# Patient Record
Sex: Female | Born: 1990 | Race: White | Hispanic: No | Smoking: Former smoker
Health system: Southern US, Community
[De-identification: ages and names within clinical notes are randomized; demographics above are authoritative.]

## PROBLEM LIST (undated history)

## (undated) DIAGNOSIS — F319 Bipolar disorder, unspecified: Secondary | ICD-10-CM

## (undated) DIAGNOSIS — R519 Headache, unspecified: Secondary | ICD-10-CM

## (undated) DIAGNOSIS — G8929 Other chronic pain: Secondary | ICD-10-CM

## (undated) DIAGNOSIS — F419 Anxiety disorder, unspecified: Secondary | ICD-10-CM

## (undated) DIAGNOSIS — F32A Depression, unspecified: Secondary | ICD-10-CM

## (undated) DIAGNOSIS — G44009 Cluster headache syndrome, unspecified, not intractable: Secondary | ICD-10-CM

## (undated) DIAGNOSIS — G43909 Migraine, unspecified, not intractable, without status migrainosus: Secondary | ICD-10-CM

## (undated) DIAGNOSIS — G4700 Insomnia, unspecified: Secondary | ICD-10-CM

## (undated) HISTORY — DX: Migraine, unspecified, not intractable, without status migrainosus: G43.909

## (undated) HISTORY — PX: NO PAST SURGERIES: SHX2092

## (undated) HISTORY — DX: Bipolar disorder, unspecified: F31.9

## (undated) HISTORY — DX: Headache, unspecified: R51.9

## (undated) HISTORY — DX: Depression, unspecified: F32.A

## (undated) HISTORY — DX: Insomnia, unspecified: G47.00

## (undated) HISTORY — DX: Anxiety disorder, unspecified: F41.9

## (undated) HISTORY — DX: Cluster headache syndrome, unspecified, not intractable: G44.009

## (undated) HISTORY — DX: Other chronic pain: G89.29

---

## 2015-05-06 HISTORY — PX: WISDOM TOOTH EXTRACTION: SHX21

## 2016-04-13 ENCOUNTER — Emergency Department (HOSPITAL_COMMUNITY)
Admission: EM | Admit: 2016-04-13 | Discharge: 2016-04-14 | Disposition: A | Discharge status: Home / Self Care | Payer: BLUE CROSS/BLUE SHIELD | Attending: Emergency Medicine | Admitting: Emergency Medicine

## 2016-04-13 ENCOUNTER — Emergency Department (HOSPITAL_COMMUNITY): Payer: BLUE CROSS/BLUE SHIELD

## 2016-04-13 ENCOUNTER — Encounter (HOSPITAL_COMMUNITY): Payer: Self-pay | Admitting: Emergency Medicine

## 2016-04-13 DIAGNOSIS — W2209XA Striking against other stationary object, initial encounter: Secondary | ICD-10-CM | POA: Insufficient documentation

## 2016-04-13 DIAGNOSIS — S90122A Contusion of left lesser toe(s) without damage to nail, initial encounter: Secondary | ICD-10-CM | POA: Insufficient documentation

## 2016-04-13 DIAGNOSIS — S99922A Unspecified injury of left foot, initial encounter: Secondary | ICD-10-CM | POA: Diagnosis present

## 2016-04-13 DIAGNOSIS — Y939 Activity, unspecified: Secondary | ICD-10-CM | POA: Insufficient documentation

## 2016-04-13 DIAGNOSIS — Y929 Unspecified place or not applicable: Secondary | ICD-10-CM | POA: Diagnosis not present

## 2016-04-13 DIAGNOSIS — Y999 Unspecified external cause status: Secondary | ICD-10-CM | POA: Insufficient documentation

## 2016-04-13 NOTE — ED Provider Notes (Signed)
[facility]-EMERGENCY DEPT Provider Note   CSN: [id number] Arrival date & time: 04/13/16  2200   By signing my name below, I, Clarisse GougeXavier Herndon, attest that this documentation has been prepared under the direction and in the presence of Devoria AlbeIva Kaleiah Kutzer, MD. Electronically signed, Clarisse GougeXavier Herndon, ED Scribe. 04/14/16. 11:25 PM.   Time seen 23:20 PM  History   Chief Complaint Chief Complaint  Patient presents with  . Toe Injury   The history is provided by the patient. No language interpreter was used.    HPI Comments: Veronica Baxter is a [age] y.o. female who presents to the Emergency Department complaining of constant left 3rd and 4th toe pain onset ~8:00 PM this evening. She states that she accidentally kicked a cabinet and she heard a "crack" on impact. Her pain is exacerbated by walking. She states that she has not taken medications for pain. Pt notes she is unemployed, a non-smoker, and an occasional drinker.  PCP [facility] in [place]  History reviewed. No pertinent past medical history.  There are no active problems to display for this patient.   History reviewed. No pertinent surgical history.  OB History    No data available       Home Medications    Prior to Admission medications   Not on File    Family History No family history on file.  Social History Social History  Substance Use Topics  . Smoking status: Never Smoker  . Smokeless tobacco: Never Used  . Alcohol use No     Allergies   Penicillins   Review of Systems Review of Systems  Gastrointestinal: Negative for diarrhea, nausea and vomiting.  Musculoskeletal: Positive for arthralgias and gait problem.  Skin: Negative for wound.  All other systems reviewed and are negative.    Physical Exam Updated Vital Signs BP (!) 144/101 (BP Location: Left Arm)   Pulse 86   Temp 98.4 F (36.9 C) (Oral)   Resp 18   Ht 5\' 9"  (1.753 m)   LMP 04/06/2016   SpO2 98%   Vital signs normal except for  hypertension   Physical Exam  Constitutional: She is oriented to person, place, and time. She appears well-developed and well-nourished.  Non-toxic appearance. She does not appear ill. No distress.  HENT:  Head: Normocephalic and atraumatic.  Right Ear: External ear normal.  Left Ear: External ear normal.  Nose: Nose normal.  Mouth/Throat: Mucous membranes are normal.  Eyes: Conjunctivae and EOM are normal.  Neck: Normal range of motion and full passive range of motion without pain. Neck supple.  Cardiovascular: Normal rate.   Pulmonary/Chest: Effort normal. No respiratory distress. She has no rhonchi. She exhibits no crepitus.  Abdominal: Normal appearance.  Musculoskeletal: Normal range of motion. She exhibits tenderness. She exhibits no edema or deformity.  Moves all extremities well. Left foot non-tender. Left ankle non-tender. Left 3rd and 4th toes tender with no obvious deformity. No nail deformity, abrasions.  Neurological: She is alert and oriented to person, place, and time. She has normal strength. No cranial nerve deficit.  Skin: Skin is warm, dry and intact. No rash noted. No erythema. No pallor.  Psychiatric: She has a normal mood and affect. Her speech is normal and behavior is normal. Her mood appears not anxious.  Nursing note and vitals reviewed.    ED Treatments / Results  DIAGNOSTIC STUDIES: Oxygen Saturation is 98% on RA, normal by my interpretation.       Radiology Dg Foot Complete Left  Result Date: 04/13/2016 CLINICAL DATA:  Pain and erythema. Swelling of the third and fourth toes. Caps EXAM: LEFT FOOT - COMPLETE 3+ VIEW COMPARISON:  None. FINDINGS: There is no evidence of fracture or dislocation. Small plantar calcaneal enthesophyte is noted. Mild soft tissue swelling of the toes, especially the fourth digit. IMPRESSION: No acute osseous abnormality of the left foot. Mild soft tissue swelling of the toes. Electronically Signed   By: Tollie Ethavid  Kwon M.D.   On:  04/13/2016 23:11    Procedures Procedures (including critical care time)  Medications Ordered in ED Medications - No data to display   Initial Impression / Assessment and Plan / ED Course  I have reviewed the triage vital signs and the nursing notes.  Pertinent labs & imaging results that were available during my care of the patient were reviewed by me and considered in my medical decision making (see chart for details).  Clinical Course    COORDINATION OF CARE: 12:01 AM Discussed treatment plan with pt at bedside and pt agreed to plan. Discussed her xray results. She refused ibuprofen for pain, states she was going to take it at home. Pt is not unhappy about being offered ibuprofen.  Pt placed in a post-op shoe and asked to walk in it to see if she needs crutches. Her toes were buddy taped. We discussed she could have an occult fracture that is not visible on x-ray today. If she continues to have pain after the next week a repeat x-ray may show signs of a healing fracture. She will be given orthopedic referral.  11:55 PM She tolerated the post-operational boot well, and she has declined crutches.  Final Clinical Impressions(s) / ED Diagnoses   Final diagnoses:  Contusion of lesser toe of left foot without damage to nail, initial encounter    New Prescriptions OTC ibuprofen and acetaminophen  Plan discharge  Devoria AlbeIva Inita Uram, MD, FACEP   I personally performed the services described in this documentation, which was scribed in my presence. The recorded information has been reviewed and considered.  Devoria AlbeIva Cadynce Garrette, MD, Concha PyoFACEP    [place] Latulippe, MD 04/14/16 [phone]

## 2016-04-13 NOTE — ED Triage Notes (Signed)
Pt states he hit two toes on her L. Foot on a cabinet and states that she heard a crack and now she cannot move them.

## 2016-04-13 NOTE — Discharge Instructions (Signed)
Elevate your foot. Use ice packs for comfort. Take ibuprofen 600 mg + acetaminophen 1000 mg 4 times a day for pain. Wear the post-op shoe for comfort. Recheck if you are still painful in a week, an xray at that time may show signs of a healing fracture that was not visible on xrays tonight.

## 2016-04-14 NOTE — ED Notes (Signed)
Pt alert & oriented x4, stable gait. Patient given discharge instructions, paperwork & prescription(s). Patient  instructed to stop at the registration desk to finish any additional paperwork. Patient verbalized understanding. Pt left department w/ no further questions. 

## 2018-07-18 ENCOUNTER — Encounter (HOSPITAL_COMMUNITY): Payer: Self-pay | Admitting: Emergency Medicine

## 2018-07-18 ENCOUNTER — Other Ambulatory Visit: Payer: Self-pay

## 2018-07-18 ENCOUNTER — Emergency Department (HOSPITAL_COMMUNITY)
Admission: EM | Admit: 2018-07-18 | Discharge: 2018-07-18 | Disposition: A | Discharge status: Home / Self Care | Payer: Self-pay | Attending: Emergency Medicine | Admitting: Emergency Medicine

## 2018-07-18 ENCOUNTER — Emergency Department (HOSPITAL_COMMUNITY): Payer: Self-pay

## 2018-07-18 DIAGNOSIS — Y9289 Other specified places as the place of occurrence of the external cause: Secondary | ICD-10-CM | POA: Insufficient documentation

## 2018-07-18 DIAGNOSIS — Y990 Civilian activity done for income or pay: Secondary | ICD-10-CM | POA: Insufficient documentation

## 2018-07-18 DIAGNOSIS — M25531 Pain in right wrist: Secondary | ICD-10-CM | POA: Insufficient documentation

## 2018-07-18 DIAGNOSIS — X500XXA Overexertion from strenuous movement or load, initial encounter: Secondary | ICD-10-CM | POA: Insufficient documentation

## 2018-07-18 DIAGNOSIS — Y93G1 Activity, food preparation and clean up: Secondary | ICD-10-CM | POA: Insufficient documentation

## 2018-07-18 NOTE — ED Provider Notes (Signed)
[facility]-EMERGENCY DEPT Provider Note   CSN: [id number] Arrival date & time: 07/18/18  1855    History   Chief Complaint Chief Complaint  Patient presents with  . Wrist Pain    HPI Veronica Baxter is a 28 y.o. 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  She is right-handed.  She denies any numbness or tingling.  She is full range of motion of her fingers.  She denies any significant pain in her hand except for her distal wrist and with carpal.  She denies taking any medication prior to arrival.  She denies any other injuries.     HPI  History reviewed. No pertinent past medical history.  There are no active problems to display for this patient.   History reviewed. No pertinent surgical history.   OB History   No obstetric history on file.      Home Medications    Prior to Admission medications   Not on File    Family History History reviewed. No pertinent family history.  Social History Social History   Tobacco Use  . Smoking status: Never Smoker  . Smokeless tobacco: Never Used  Substance Use Topics  . Alcohol use: No  . Drug use: Not on file     Allergies   Penicillins   Review of Systems Review of Systems  Musculoskeletal: Positive for arthralgias.  Neurological: Negative for numbness.     Physical Exam Updated Vital Signs BP (!) 136/94 (BP Location: Left Arm)   Pulse 81   Temp 98.6 F (37 C) (Oral)   Resp 14   Ht 5\' 9"  (1.753 m)   Wt 121.6 kg   LMP 06/19/2018   SpO2 100%   BMI 39.58 kg/m   Physical Exam Vitals signs and nursing note reviewed.  Constitutional:      General: She is not in acute distress.    Appearance: She is well-developed. She is not diaphoretic.  HENT:     Head: Normocephalic and atraumatic.     Mouth/Throat:     Pharynx: No oropharyngeal exudate.   Eyes:     General: No scleral icterus.       Right eye: No discharge.        Left eye: No discharge.     Conjunctiva/sclera: Conjunctivae normal.     Pupils: Pupils are equal, round, and reactive to light.  Neck:     Musculoskeletal: Normal range of motion and neck supple.     Thyroid: No thyromegaly.  Cardiovascular:     Rate and Rhythm: Normal rate and regular rhythm.     Heart sounds: Normal heart sounds. No murmur. No friction rub. No gallop.   Pulmonary:     Effort: Pulmonary effort is normal. No respiratory distress.     Breath sounds: Normal breath sounds. No stridor. No wheezing or rales.  Musculoskeletal:     Comments: Tenderness to the distal ulna and ulnar side of the wrist extending to the mid fifth metacarpal, very mild anatomical snuffbox tenderness, full range of motion of the digits, patient splinting wrist, sensation intact, radial pulse intact; no tenderness to palpation to the digits, forearm, or elbow  Lymphadenopathy:     Cervical: No cervical adenopathy.  Skin:    General: Skin is warm and dry.     Coloration: Skin is not pale.  Findings: No rash.  Neurological:     Mental Status: She is alert.     Coordination: Coordination normal.      ED Treatments / Results  Labs (all labs ordered are listed, but only abnormal results are displayed) Labs Reviewed - No data to display  EKG None  Radiology Dg Wrist Complete Right  Result Date: 07/18/2018 CLINICAL DATA:  Wrist injury. Pain along the ulnar aspect of wrist and 5th metacarpal. EXAM: RIGHT WRIST - COMPLETE 3+ VIEW COMPARISON:  None. FINDINGS: There is no evidence of fracture or dislocation. There is no evidence of arthropathy or other focal bone abnormality. Soft tissues are unremarkable. IMPRESSION: Negative. Electronically Signed   By: Charlett Nose M.D.   On: 07/18/2018 19:45    Procedures Procedures (including critical care time)  Medications Ordered in ED Medications - No data to display    Initial Impression / Assessment and Plan / ED Course  I have reviewed the triage vital signs and the nursing notes.  Pertinent labs & imaging results that were available during my care of the patient were reviewed by me and considered in my medical decision making (see chart for details).        Patient with right wrist pain after hearing a pop while scooping ice cream.  X-ray is negative. Patient placed in brace for comfort.  Suspect sprain or other soft tissue injury.  Although very mild anatomical snuffbox tenderness, doubt scaphoid injury, as patient did not have any trauma or FOOSH.  Follow-up to orthopedics if symptoms not improving.  Rest, ice, NSAIDs, Tylenol discussed.  Patient understands and agrees with plan.  Patient vital stable throughout ED course and discharged in satisfactory condition.  Final Clinical Impressions(s) / ED Diagnoses   Final diagnoses:  Right wrist pain    ED Discharge Orders    None       Emi Holes, PA-C 07/18/18 1958    Melene Plan, DO 07/18/18 2003

## 2018-07-18 NOTE — ED Triage Notes (Signed)
The patient was scooping ice cream at work when she noticed her wrist pop and had pain immediately after.

## 2018-07-18 NOTE — Discharge Instructions (Signed)
Wear brace at all times for support.  Use ice 3-4 times daily alternating 20 minutes on, 20 minutes off.  Alternate ibuprofen and Tylenol as prescribed for your pain.  Limit use of your wrist, especially without the brace.  If your symptoms are not improving over the next 7 to 10 days, please follow-up with Dr. Dion Saucier, orthopedic doctor, for further evaluation and treatment.  Please return to the emergency department if you develop any new or worsening symptoms.

## 2020-04-04 ENCOUNTER — Encounter: Payer: Self-pay | Admitting: *Deleted

## 2020-04-06 ENCOUNTER — Encounter: Payer: Self-pay | Admitting: Neurology

## 2020-04-06 ENCOUNTER — Ambulatory Visit: Payer: BC Managed Care – PPO | Admitting: Neurology

## 2020-04-06 VITALS — BP 142/96 | HR 90 | Ht 69.0 in | Wt 305.0 lb

## 2020-04-06 DIAGNOSIS — G44099 Other trigeminal autonomic cephalgias (TAC), not intractable: Secondary | ICD-10-CM | POA: Diagnosis not present

## 2020-04-06 DIAGNOSIS — G44019 Episodic cluster headache, not intractable: Secondary | ICD-10-CM | POA: Diagnosis not present

## 2020-04-06 DIAGNOSIS — R51 Headache with orthostatic component, not elsewhere classified: Secondary | ICD-10-CM

## 2020-04-06 DIAGNOSIS — G43709 Chronic migraine without aura, not intractable, without status migrainosus: Secondary | ICD-10-CM

## 2020-04-06 DIAGNOSIS — R519 Headache, unspecified: Secondary | ICD-10-CM | POA: Diagnosis not present

## 2020-04-06 MED ORDER — INDOMETHACIN ER 75 MG PO CPCR: 75.0000 mg | ORAL_CAPSULE | Freq: Two times a day (BID) | ORAL | 6 refills | Status: DC

## 2020-04-06 MED ORDER — GALCANEZUMAB-GNLM 120 MG/ML ~~LOC~~ SOAJ: 360.0000 mg | Freq: Once | SUBCUTANEOUS | Status: DC

## 2020-04-06 MED ORDER — EMGALITY (300 MG DOSE) 100 MG/ML ~~LOC~~ SOSY
300.0000 mg | PREFILLED_SYRINGE | SUBCUTANEOUS | 11 refills | Status: DC
Start: 2020-04-06 — End: 2020-11-14

## 2020-04-06 NOTE — Patient Instructions (Addendum)
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 Indomethacin is a kind of NSAID (nonsteroidal anti-inflammatory drug). Indomethacin can quickly stop the pain from some kinds of headaches, such as:  Paroxysmal hemicrania. This is a series of short, severe headaches, usually on just one side of the head.  Hemicrania continua. Pain is nonstop and on one side of the face.  Primary exertional headache. Exercise sets off these headaches.  Primary cough headache. Pain may come from pressure in the brain when coughing or straining. What are the causes? The exact cause of this condition is not known. Certain conditions may start (trigger) a headache. They include:  Moving the head in certain ways.  Stress.  Pressure on sensitive areas of the neck.  Drinking alcohol.  Exercise.  Coughing and sneezing. What increases the risk? The following factors may make you more likely to develop this condition:  Being 46 years of age or older.  Having a serious head injury.  Having migraine headaches.  Having a family history of this condition. What are the signs or symptoms? Symptoms of this condition depend on the kind of headache you have.  Paroxysmal hemicrania: ? Having about 10 headaches a day. Each may last from a few minutes to 2 hours. ? Severe, pounding pain. ? Pain usually on just one side of the head. It often centers around the eye or in the forehead. ? A watery eye, which may become red or swollen. ? A droopy or swollen eyelid. ? Sweating and having a red or pinkish face. ? A stuffy, runny nose.  Hemicrania continua: ? All-day headache. This may occur daily for at least 3 months. Then there may be no headaches for weeks or  months. ? Pain that gets worse several times during the day. ? Pain in the face, on one side only. It almost always occurs on the same side. ? A watery eye. It may also become droopy, red, and swollen. ? A stuffy, runny nose. ? Pain that gets worse with sound or light.  Primary exertional headache: ? Pain during physical activity. ? Pounding or throbbing pain. ? Pain that lasts for 5 minutes to 48 hours, or sometimes longer.  Primary cough headache: ? Pain that starts after coughing, sneezing, or straining. ? Sharp, stabbing pain. ? Pain on both sides of the head. It is often worse in the back of the head. ? Pain that is severe for a few minutes and then dull for several hours. How is this diagnosed? This condition may be diagnosed based on:  Symptoms and medical history. Your health care provider will ask you questions about your headaches.  Physical exam.  Tests that may include: ? Blood and urine tests. ? Spinal tap (lumbar puncture). This tests a sample of fluid from your spine. The test checks for infection, bleeding in your brain (brain hemorrhage), or extra pressure inside your skull. ? Ultrasound, MRI, CT scan, or other imaging tests. If no medical condition is causing your headaches, you will be given indomethacin. If yours is an indomethacin-responsive headache, your symptoms should go away quickly. How is this treated?  By taking indomethacin.  With other medicines: ? To prevent or treat stomach ulcers. ? To relieve stomachache or heartburn (antacids). ? For nausea. Follow these instructions  at home: Lifestyle  Rest in a dark, quiet room.  Put a cool, damp washcloth on your head or face.  Get plenty of sleep. Most adults should get at least 7-9 hours of sleep each night.  Eat on a regular schedule. Do not skip meals.  Limit alcohol intake to no more than 1 drink per day for non-pregnant women and 2 drinks per day for men. One drink equals 12 ounces of beer,  5 ounces of wine, or 1 ounces of hard liquor.  Do not use any products that contain nicotine or tobacco, such as cigarettes and e-cigarettes. If you need help quitting, ask your health care provider. Headache diary Keep a headache diary. This will help you and your health care provider determine what is triggering your headaches. Each time you have a headache, write down:  When it started and stopped. Include the day and time.  How it felt.  Any triggers, such as noise, stress, or foods.  Any medicines you took.  General instructions  Take over-the-counter and prescription medicines only as told by your health care provider. ? Do not take other NSAIDs, such as ibuprofen, with indomethacin.  Tell your health care provider about all medicines you are taking, including vitamins, herbs, eye drops, creams, and over-the-counter medicines.  Keep all follow-up visits as told by your health care provider. This is important. Contact a health care provider if:  Your pain continues even with treatment.  You have nausea.  You have a fever. Get help right away if:  You have bad stomach pain or vomiting.  You vomit blood.  You have blood in your stool.  You have chest pain.  You have any symptoms of a stroke. "BE FAST" is an easy way to remember the main warning signs of a stroke: ? B - Balance. Signs are dizziness, sudden trouble walking, or loss of balance. ? E - Eyes. Signs are trouble seeing or a sudden change in vision. ? F - Face. Signs are sudden weakness or numbness of the face, or the face or eyelid drooping on one side. ? A - Arms. Signs are weakness or numbness in an arm. This happens suddenly and usually on one side of the body. ? S - Speech. Signs are sudden trouble speaking, slurred speech, or trouble understanding what people say. ? T - Time. Time to call emergency services. Write down what time symptoms started.  You have other signs of a stroke, such as: ? A sudden,  severe headache. ? Nausea or vomiting. ? Seizure. Summary  An indomethacin-responsive headache is a headache that gets better when you take indomethacin, a medicine that stops inflammation.  The exact cause of this condition is not known, but there are certain conditions that may start (trigger) a headache.  Keep a headache diary to help your health care provider determine your triggers.  Treatment of this condition includes indomethacin, but it may include other medicines to relieve other symptoms. This information is not intended to replace advice given to you by your health care provider. Make sure you discuss any questions you have with your health care provider. Document Revised: 06/14/2018 Document Reviewed: 05/02/2017 Elsevier Patient Education  2020 [employer]  Cluster Headache A cluster headache is a type of headache that causes deep, intense head pain. Cluster headaches can last from 15 minutes to 3 hours. They usually occur:  On one side of the head. They may occur on the other side when a new cluster of  headaches begins.  Repeatedly over weeks to months.  Several times a day.  At the same time of day, often at night.  More often in the fall and springtime. What are the causes? The cause of this condition is not known. What increases the risk? This condition is more likely to develop in:  Males.  People who drink alcohol.  People who smoke or use products that contain nicotine or tobacco.  People who take medicines that cause blood vessels to expand, such as nitroglycerin.  People who take antihistamines. What are the signs or symptoms? Symptoms of this condition include:  Severe pain on one side of the head that begins behind or around your eye or temple.  Pain on one side of the head.  Nausea.  Sensitivity to light.  Runny nose and nasal stuffiness.  Sweaty, pale skin on the face.  Droopy or swollen eyelid, eye redness, or  tearing.  Restlessness and agitation. How is this diagnosed? This condition may be diagnosed based on:  Your symptoms.  A physical exam. Your health care provider may order tests to see if your headaches are caused by another medical condition. These tests may show that you do not have cluster headaches. Tests may include:  A CT scan of your head.  An MRI of your head.  Lab tests. How is this treated? This condition may be treated with:  Medicines to relieve pain and to prevent repeated (recurrent) attacks. Some people may need a combination of medicines.  Oxygen. This helps to relieve pain. Follow these instructions at home: Headache diary Keep a headache diary as told by your health care provider. Doing this can help you and your health care provider figure out what triggers your headaches. In your headache diary, include information about:  The time of day that your headache started and what you were doing when it began.  How long your headache lasted.  Where your pain started and whether it moved to other areas.  The type of pain, such as burning, stabbing, throbbing, or cramping.  Your level of pain. Use a pain scale and rate the pain with a number from 1 (mild) up to 10 (severe).  The treatment that you used, and any change in symptoms after treatment.  Medicines  Take over-the-counter and prescription medicines only as told by your health care provider.  Do not drive or use heavy machinery while taking prescription pain medicine.  Use oxygen as told by your health care provider. Lifestyle  Follow a regular sleep schedule. Do not vary the time that you go to bed or the amount that you sleep from day to day. It is important to stay on the same schedule during a cluster period to help prevent headaches.  Exercise regularly.  Eat a healthy diet and avoid foods that may trigger your headaches.  Avoid alcohol.  Do not use any products that contain nicotine or  tobacco, such as cigarettes and e-cigarettes. If you need help quitting, ask your health care provider. Contact a health care provider if:  Your headaches change, become more severe, or occur more often.  The medicine or oxygen that your health care provider recommended does not help. Get help right away if:  You faint.  You have weakness or numbness, especially on one side of your body or face.  You have double vision.  You have nausea or vomiting that does not go away within several hours.  You have trouble talking, walking, or keeping your  balance.  You have pain or stiffness in your neck.  You have a fever. Summary  A cluster headache is a type of headache that causes deep, intense head pain, usually on one side of the head.  Keep a headache diary to help discover what triggers your headaches.  A regular sleep schedule can help prevent headaches. This information is not intended to replace advice given to you by your health care provider. Make sure you discuss any questions you have with your health care provider. Document Revised: 09/29/2018 Document Reviewed: 01/01/2016 Elsevier Patient Education  2020 [employer]   Indomethacin capsules What is this medicine? INDOMETHACIN (in doe METH a sin) is a non-steroidal anti-inflammatory drug (NSAID). It is used to reduce swelling and to treat pain. It may be used for painful joint and muscular problems such as arthritis, tendinitis, bursitis, and gout. This medicine may be used for other purposes; ask your health care provider or pharmacist if you have questions. COMMON BRAND NAME(S): Indocin, TIVORBEX What should I tell my health care provider before I take this medicine? They need to know if you have any of these conditions:  asthma, especially aspirin sensitive asthma  coronary artery bypass graft (CABG) surgery within the past 2 weeks  depression  drink more than 3 alcohol containing drinks a day  heart disease  or circulation problems like heart failure or leg edema (fluid retention)  high blood pressure  kidney disease  liver disease  Parkinson's disease  seizures  stomach bleeding or ulcers  an unusual or allergic reaction to indomethacin, aspirin, other NSAIDs, other medicines, foods, dyes, or preservatives  pregnant or trying to get pregnant  breast-feeding How should I use this medicine? Take this medicine by mouth with food and with a full glass of water. Follow the directions on the prescription label. Take your medicine at regular intervals. Do not take your medicine more often than directed. Long-term, continuous use may increase the risk of heart attack or stroke. A special MedGuide will be given to you by the pharmacist with each prescription and refill. Be sure to read this information carefully each time. Talk to your pediatrician regarding the use of this medicine in children. Special care may be needed. While this drug may be prescribed for children as young as 15 years for selected conditions, precautions do apply. Elderly patients over 70 years old may have a stronger reaction and need a smaller dose. Overdosage: If you think you have taken too much of this medicine contact a poison control center or emergency room at once. NOTE: This medicine is only for you. Do not share this medicine with others. What if I miss a dose? If you miss a dose, take it as soon as you can. If it is almost time for your next dose, take only that dose. Do not take double or extra doses. What may interact with this medicine? Do not take this medicine with any of the following medications:  cidofovir  diflunisal  ketorolac  methotrexate  pemetrexed  triamterene This medicine may also interact with the following medications:  alcohol  antacids  aspirin and aspirin-like medicines  cyclosporine  digoxin  diuretics  lithium  medicines for diabetes  medicines for high blood  pressure  medicines that affect platelets  medicines that treat or prevent blood clots like warfarin  NSAIDs, medicines for pain and inflammation, like ibuprofen or naproxen  probenecid  steroid medicines like prednisone or cortisone This list may not describe all possible  interactions. Give your health care provider a list of all the medicines, herbs, non-prescription drugs, or dietary supplements you use. Also tell them if you smoke, drink alcohol, or use illegal drugs. Some items may interact with your medicine. What should I watch for while using this medicine? Tell your doctor or healthcare provider if your pain does not get better. Talk to your doctor before taking another medicine for pain. Do not treat yourself. This medicine may cause serious skin reactions. They can happen weeks to months after starting the medicine. Contact your healthcare provider right away if you notice fevers or flu-like symptoms with a rash. The rash may be red or purple and then turn into blisters or peeling of the skin. Or, you might notice a red rash with swelling of the face, lips or lymph nodes in your neck or under your arms. This medicine does not prevent heart attack or stroke. In fact, this medicine may increase the chance of a heart attack or stroke. The chance may increase with longer use of this medicine and in people who have heart disease. If you take aspirin to prevent heart attack or stroke, talk with your doctor or healthcare provider. Do not take medicines such as ibuprofen and naproxen with this medicine. Side effects such as stomach upset, nausea, or ulcers may be more likely to occur. Many medicines available without a prescription should not be taken with this medicine. This medicine can cause ulcers and bleeding in the stomach and intestines at any time during treatment. Do not smoke cigarettes or drink alcohol. These increase irritation to your stomach and can make it more susceptible to  damage from this medicine. Ulcers and bleeding can happen without warning symptoms and can cause death. You may get drowsy or dizzy. Do not drive, use machinery, or do anything that needs mental alertness until you know how this medicine affects you. Do not stand or sit up quickly, especially if you are an older patient. This reduces the risk of dizzy or fainting spells. This medicine can cause you to bleed more easily. Try to avoid damage to your teeth and gums when you brush or floss your teeth. What side effects may I notice from receiving this medicine? Side effects that you should report to your doctor or health care professional as soon as possible:  allergic reactions like skin rash, itching or hives, swelling of the face, lips, or tongue  difficulty breathing or wheezing  nausea, vomiting  redness, blistering, peeling, or loosening of the skin, including inside the mouth  signs and symptoms of bleeding such as bloody or black, tarry stools; red or dark-brown urine; spitting up blood or brown material that looks like coffee grounds; red spots on the skin; unusual bruising or bleeding from the eye, gums, or nose  signs and symptoms of a blood clot such as changes in vision; chest pain; severe, sudden headache; trouble speaking; sudden numbness or weakness of the face, arm, or leg; trouble walking  unexplained weight gain or swelling  unusually weak or tired  yellowing of eyes or skin Side effects that usually do not require medical attention (report to your doctor or health care professional if they continue or are bothersome):  diarrhea  dizziness  headache  heartburn This list may not describe all possible side effects. Call your doctor for medical advice about side effects. You may report side effects to FDA at 1-800-FDA-1088. Where should I keep my medicine? Keep out of the reach of children.  Store at room temperature between 15 and 30 degrees C (59 and 86 degrees F).  Keep container tightly closed. Throw away any unused medicine after the expiration date. NOTE: This sheet is a summary. It may not cover all possible information. If you have questions about this medicine, talk to your doctor, pharmacist, or health care provider.  2020 Elsevier/[employer] Standard (2018-07-07 14:57:14) Galcanezumab injection What is this medicine? GALCANEZUMAB (gal ka NEZ ue mab) is used to prevent migraines and treat cluster headaches. This medicine may be used for other purposes; ask your health care provider or pharmacist if you have questions. COMMON BRAND NAME(S): Emgality What should I tell my health care provider before I take this medicine? They need to know if you have any of these conditions:  an unusual or allergic reaction to galcanezumab, other medicines, foods, dyes, or preservatives  pregnant or trying to get pregnant  breast-feeding How should I use this medicine? This medicine is for injection under the skin. You will be taught how to prepare and give this medicine. Use exactly as directed. Take your medicine at regular intervals. Do not take your medicine more often than directed. It is important that you put your used needles and syringes in a special sharps container. Do not put them in a trash can. If you do not have a sharps container, call your pharmacist or healthcare provider to get one. Talk to your pediatrician regarding the use of this medicine in children. Special care may be needed. Overdosage: If you think you have taken too much of this medicine contact a poison control center or emergency room at once. NOTE: This medicine is only for you. Do not share this medicine with others. What if I miss a dose? If you miss a dose, take it as soon as you can. If it is almost time for your next dose, take only that dose. Do not take double or extra doses. What may interact with this medicine? Interactions are not expected. This list may not describe all  possible interactions. Give your health care provider a list of all the medicines, herbs, non-prescription drugs, or dietary supplements you use. Also tell them if you smoke, drink alcohol, or use illegal drugs. Some items may interact with your medicine. What should I watch for while using this medicine? Tell your doctor or healthcare professional if your symptoms do not start to get better or if they get worse. What side effects may I notice from receiving this medicine? Side effects that you should report to your doctor or health care professional as soon as possible:  allergic reactions like skin rash, itching or hives, swelling of the face, lips, or tongue Side effects that usually do not require medical attention (report these to your doctor or health care professional if they continue or are bothersome):  pain, redness, or irritation at site where injected This list may not describe all possible side effects. Call your doctor for medical advice about side effects. You may report side effects to FDA at 1-800-FDA-1088. Where should I keep my medicine? Keep out of the reach of children. You will be instructed on how to store this medicine. Throw away any unused medicine after the expiration date on the label. NOTE: This sheet is a summary. It may not cover all possible information. If you have questions about this medicine, talk to your doctor, pharmacist, or health care provider.  2020 [employer]vier/Gold Standard (2017-10-07 12:03:23)

## 2020-04-06 NOTE — Progress Notes (Signed)
[facility]    Provider:  Dr Lucia Gaskins Requesting Provider: [name], MD Primary Care Provider:  [name], MD  CC:  Cluster headaches  HPI:  Veronica Baxter is a 29 y.o. female here as requested by [name], MD for cluster headaches. She is here with her partner who also provides information. Sudden, out of the  blue, a hot iron rod piercing through her skull, super sudden intense pain, she can see starts, can last 1-2 minutes, always on the left, pain behind the eye, after it subsides she can feel disoriented and dizzy, she has passed out, then it can happen again in 20 minutes or in 2 hours, they come on at random, the most intense pain is one - two minutes but max 10 miuntes but severe and brief, hot poker multiple stabs. Started about 1.5 years ago. She will have them for periods of time multiple times a day, 5-10 times a day, multiple days a week, periods of times it doesn't happen weeks at most, she also has migraines as well, happens around her cycle, a really dull painful whole head, like a band around her head, pulsating/throbbing, light and sound sensitivity, a cold dark room and movement makes it worse. When she gets the other headaches it is scary and she paces and is agitated. It is debilitating for a day -2. He saw Dr. Neale Burly more migraine sin the past and she tried biofeedback, tried flexeril, topamax, imitrex, amitriptyline, imipramine, propranolol. She hs a lot of anxiety. She gets migraines 8 days a month and she has at least half the month of headaches if not every day. Here with her partner who provides much information. Left eyelid gets swollen. No other focal neurologic deficits, associated symptoms, inciting events or modifiable factors.  Reviewed notes, labs and imaging from outside physicians, which showed:lamictal, propranolol, seroquel, amitriptyline, cannot take Verapamil(has had side effects to blood pressure medications), topamax  contraindicated due to severe depression and anxiety and she is on other AEDs  From a thorough review of records she has tried the following:   Review of Systems: Patient complains of symptoms per HPI as well as the following symptoms: headaches, migraines. Pertinent negatives and positives per HPI. All others negative.   Social History   Socioeconomic History  . Marital status: Single    Spouse name: [name]  . Number of children: 0  . Years of education: Associates   . Highest education level: Not on file  Occupational History  . Occupation: Archivist     Comment: student  Tobacco Use  . Smoking status: Former Smoker    Types: Cigarettes    Quit date: 2019    Years since quitting: 2.9  . Smokeless tobacco: Never Used  . Tobacco comment: smoked socially   Substance and Sexual Activity  . Alcohol use: No  . Drug use: Not on file  . Sexual activity: Not on file  Other Topics Concern  . Not on file  Social History Narrative   Right handed   Lives with mother and stepfather   Married but her and husband living separately currently   Caffeine use: 5-6/week (iced coffee and sweet tea sometimes)   Social Determinants of Health   Financial Resource Strain:   . Difficulty of Paying Living Expenses: Not on file  Food Insecurity:   . Worried About Programme researcher, broadcasting/film/video in the Last Year: Not on file  . Ran Out of Food in the Last  Year: Not on file  Transportation Needs:   . Lack of Transportation (Medical): Not on file  . Lack of Transportation (Non-Medical): Not on file  Physical Activity:   . Days of Exercise per Week: Not on file  . Minutes of Exercise per Session: Not on file  Stress:   . Feeling of Stress : Not on file  Social Connections:   . Frequency of Communication with Friends and Family: Not on file  . Frequency of Social Gatherings with Friends and Family: Not on file  . Attends Religious Services: Not on file  . Active Member of Clubs or Organizations: Not  on file  . Attends BankerClub or Organization Meetings: Not on file  . Marital Status: Not on file  Intimate Partner Violence:   . Fear of Current or Ex-Partner: Not on file  . Emotionally Abused: Not on file  . Physically Abused: Not on file  . Sexually Abused: Not on file    Family History  Problem Relation Age of Onset  . Obesity Mother   . Obesity Father   . Sleep apnea Father   . Diabetes Maternal Grandmother   . Migraines Maternal Grandmother   . Stroke Maternal Grandfather     Past Medical History:  Diagnosis Date  . Anxiety   . Bipolar disorder (HCC)   . Chronic headaches    since childhood, saw Dr Neale [facility]Freeman, [facility]  . Cluster headaches   . Depression   . Insomnia   . Migraine     Patient Active Problem List   Diagnosis Date Noted  . 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 NO PAST SURGERIES      Current Outpatient Medications  Medication Sig Dispense Refill  . busPIRone (BUSPAR) 10 MG tablet Take 10 mg by mouth 2 (two) times daily.     Marland Kitchen. lamoTRIgine (LAMICTAL) 200 MG tablet Take 200 mg by mouth daily.    Marland Kitchen. levonorgestrel (MIRENA) 20 MCG/24HR IUD 1 each by Intrauterine route once.    Marland Kitchen. QUEtiapine (SEROQUEL) 100 MG tablet Take 100 mg by mouth at bedtime.    . Galcanezumab-gnlm (EMGALITY, 300 MG DOSE,) 100 MG/ML SOSY Inject 300 mg into the skin every 30 (thirty) days. 3.08 mL 11  . indomethacin (INDOCIN SR) 75 MG CR capsule Take 1 capsule (75 mg total) by mouth 2 (two) times daily with a meal. 60 capsule 6   Current Facility-Administered Medications  Medication Dose Route Frequency Provider Last Rate Last Admin  . Galcanezumab-gnlm SOAJ 360 mg  360 mg Subcutaneous Once Anson FretAhern, Deashia Soule B, MD        Allergies as of 04/06/2020 - Review Complete 04/06/2020  Allergen Reaction Noted  . Penicillins Hives 04/13/2016    Vitals: BP (!) 142/96 (BP Location: Right Arm, Patient Position: Sitting,  Cuff Size: Normal)   Pulse 90   Ht 5\' 9"  (1.753 m)   Wt (!) 305 lb (138.3 kg)   BMI 45.04 kg/m  Last Weight:  Wt Readings from Last 1 Encounters:  04/06/20 (!) 305 lb (138.3 kg)   Last Height:   Ht Readings from Last 1 Encounters:  04/06/20 5\' 9"  (1.753 m)     Physical exam: Exam: Gen: NAD, conversant, well nourised, obese, well groomed                     CV: RRR, no MRG. No Carotid Bruits. No peripheral edema, warm, nontender Eyes:  Conjunctivae clear without exudates or hemorrhage  Neuro: Detailed Neurologic Exam  Speech:    Speech is normal; fluent and spontaneous with normal comprehension.  Cognition:    The patient is oriented to person, place, and time;     recent and remote memory intact;     language fluent;     normal attention, concentration,     fund of knowledge Cranial Nerves:    The pupils are equal, round, and reactive to light. Attempted fundoscopy could not visualize. Visual fields are full to finger confrontation. Extraocular movements are intact. Trigeminal sensation is intact and the muscles of mastication are normal. The face is symmetric. The palate elevates in the midline. Hearing intact. Voice is normal. Shoulder shrug is normal. The tongue has normal motion without fasciculations.   Coordination: No dysmetria or ataxia   Gait:    Normal native gait  Motor Observation:    No asymmetry, no atrophy, and no involuntary movements noted. Tone:    Normal muscle tone.    Posture:    Posture is normal. normal erect    Strength:    Strength is V/V in the upper and lower limbs.      Sensation: intact to LT     Reflex Exam:  DTR's:    Deep tendon reflexes in the upper and lower extremities are normal bilaterally.   Toes:    The toes are downgoing bilaterally.   Clonus:    Clonus is absent.    Assessment/Plan:  30 year old with chronic migraines and either cluster headaches or paroxysmal hemicrania.   MRI w/wo contrast  Start Emgality   Start Indomethacin - If not helping by Sunday email me and we will double it to see if this is an indomethacin-responsive headache Blood work   Orders Placed This Encounter  Procedures  . MR BRAIN W WO CONTRAST  . TSH  . Comprehensive metabolic panel  . CBC   Meds ordered this encounter  Medications  . Galcanezumab-gnlm (EMGALITY, 300 MG DOSE,) 100 MG/ML SOSY    Sig: Inject 300 mg into the skin every 30 (thirty) days.    Dispense:  3.08 mL    Refill:  11  . indomethacin (INDOCIN SR) 75 MG CR capsule    Sig: Take 1 capsule (75 mg total) by mouth 2 (two) times daily with a meal.    Dispense:  60 capsule    Refill:  6  . Galcanezumab-gnlm SOAJ 360 mg   Discussed: To prevent or relieve headaches, try the following: Cool Compress. Lie down and place a cool compress on your head.  Avoid headache triggers. If certain foods or odors seem to have triggered your migraines in the past, avoid them. A headache diary might help you identify triggers.  Include physical activity in your daily routine. Try a daily walk or other moderate aerobic exercise.  Manage stress. Find healthy ways to cope with the stressors, such as delegating tasks on your to-do list.  Practice relaxation techniques. Try deep breathing, yoga, massage and visualization.  Eat regularly. Eating regularly scheduled meals and maintaining a healthy diet might help prevent headaches. Also, drink plenty of fluids.  Follow a regular sleep schedule. Sleep deprivation might contribute to headaches Consider biofeedback. With this mind-body technique, you learn to control certain bodily functions -- such as muscle tension, heart rate and blood pressure -- to prevent headaches or reduce headache pain.    Proceed to emergency room if you experience new or worsening symptoms  or symptoms do not resolve, if you have new neurologic symptoms or if headache is severe, or for any concerning symptom.   Provided education and documentation from  American headache Society toolbox including articles on: chronic migraine medication, cluster headaches, paroxysmal hemicrania, medication overuse headache, chronic migraines, prevention of migraines, behavioral and other nonpharmacologic treatments for headache.  Cc: [name], MD,    Naomie Dean, MD  [facility] [address] [place], [place] [address]  Phone [phone] Fax [phone]

## 2020-04-07 LAB — COMPREHENSIVE METABOLIC PANEL
ALT: 19 IU/L (ref 0–32)
AST: 17 IU/L (ref 0–40)
Albumin/Globulin Ratio: 1.6 (ref 1.2–2.2)
Albumin: 4.4 g/dL (ref 3.9–5.0)
Alkaline Phosphatase: 107 IU/L (ref 44–121)
BUN/Creatinine Ratio: 12 (ref 9–23)
BUN: 9 mg/dL (ref 6–20)
Bilirubin Total: 0.5 mg/dL (ref 0.0–1.2)
CO2: 24 mmol/L (ref 20–29)
Calcium: 9.5 mg/dL (ref 8.7–10.2)
Chloride: 104 mmol/L (ref 96–106)
Creatinine, Ser: 0.76 mg/dL (ref 0.57–1.00)
GFR calc Af Amer: 123 mL/min/{1.73_m2} (ref >59)
GFR calc non Af Amer: 106 mL/min/{1.73_m2} (ref >59)
Globulin, Total: 2.7 g/dL (ref 1.5–4.5)
Glucose: 104 mg/dL — ABNORMAL HIGH (ref 65–99)
Potassium: 4.7 mmol/L (ref 3.5–5.2)
Sodium: 140 mmol/L (ref 134–144)
Total Protein: 7.1 g/dL (ref 6.0–8.5)

## 2020-04-07 LAB — CBC
Hematocrit: 44.7 % (ref 34.0–46.6)
Hemoglobin: 15.6 g/dL (ref 11.1–15.9)
MCH: 32.2 pg (ref 26.6–33.0)
MCHC: 34.9 g/dL (ref 31.5–35.7)
MCV: 92 fL (ref 79–97)
Platelets: 373 10*3/uL (ref 150–450)
RBC: 4.85 x10E6/uL (ref 3.77–5.28)
RDW: 13.9 % (ref 11.7–15.4)
WBC: 9.9 10*3/uL (ref 3.4–10.8)

## 2020-04-07 LAB — TSH: TSH: 5.18 u[IU]/mL — ABNORMAL HIGH (ref 0.450–4.500)

## 2020-04-09 ENCOUNTER — Telehealth: Payer: Self-pay | Admitting: *Deleted

## 2020-04-09 ENCOUNTER — Telehealth: Payer: Self-pay | Admitting: Neurology

## 2020-04-09 NOTE — Telephone Encounter (Signed)
Called pt and LVM (ok per DPR) advising TSH slightly elevated. I advised the pt, per Dr Lucia Gaskins, to follow up with primary care for further testing for hypothyroidism. I let the pt know we need to fax the results to her PCP but we do not have one on file. I asked the pt to call us back and provide [place] with their information. Left office number and hours in the message.

## 2020-04-09 NOTE — Telephone Encounter (Signed)
-----   Message from Anson Fret, MD sent at 04/09/2020  8:31 AM EST ----- TSH is slightly abnormal, she should follow up with primary care for evaluation of hypothyroidism. Please fax labs to pcp. thanks

## 2020-04-09 NOTE — Telephone Encounter (Signed)
[employer]BS Berkley Begeman: [id number] (exp. 04/09/20 to 10/05/20) order sent to [facility]. They will reach out to the patient to schedule.

## 2020-04-10 ENCOUNTER — Telehealth: Payer: Self-pay | Admitting: Neurology

## 2020-04-10 ENCOUNTER — Telehealth: Payer: Self-pay

## 2020-04-10 NOTE — Telephone Encounter (Signed)
Pt is asking for a call to discuss her being told by her pharmacy that a PA will be needed for her Galcanezumab-gnlm (EMGALITY, 300 MG DOSE,) 100 MG/ML SOSY

## 2020-04-10 NOTE — Telephone Encounter (Signed)
Pt called office back and stated she goes to [facility] for primary care. I called [facility] and obtained fax number. Labs faxed to Dr Reola Calkins. Received a receipt of confirmation.

## 2020-04-10 NOTE — Telephone Encounter (Signed)
I called [facility] and obtained fax number. Labs faxed to Dr Reola Calkins.

## 2020-04-10 NOTE — Telephone Encounter (Signed)
Patient is calling back with info on where to send recent blood work results. Pt sees someone at [facility]. No fax number available but does have a tele [phone].

## 2020-04-11 NOTE — Telephone Encounter (Signed)
Completed Emgality 100 mg PA on Cover My Meds. Key: [id number]. Awaiting determination from [employer].

## 2020-04-11 NOTE — Telephone Encounter (Signed)
I spoke with the patient and advised of the approval of Emgality. Pt can call her pharmacy now to have this filled. Her questions were answered. She is aware the labs were faxed to Dr Reola Calkins yesterday. She verbalized appreciation.

## 2020-04-11 NOTE — Telephone Encounter (Signed)
Per Cover My Meds, Emgality approved effective from 04/11/2020 through 07/03/2020.

## 2020-04-28 IMAGING — CR RIGHT WRIST - COMPLETE 3+ VIEW
4 series · 4 of 4 positions shown · non-contrast
Comparison: None.

CLINICAL DATA: Wrist injury. Pain along the ulnar aspect of wrist
and 5th metacarpal.

EXAM:
RIGHT WRIST - COMPLETE 3+ VIEW

[x wrist pa right]
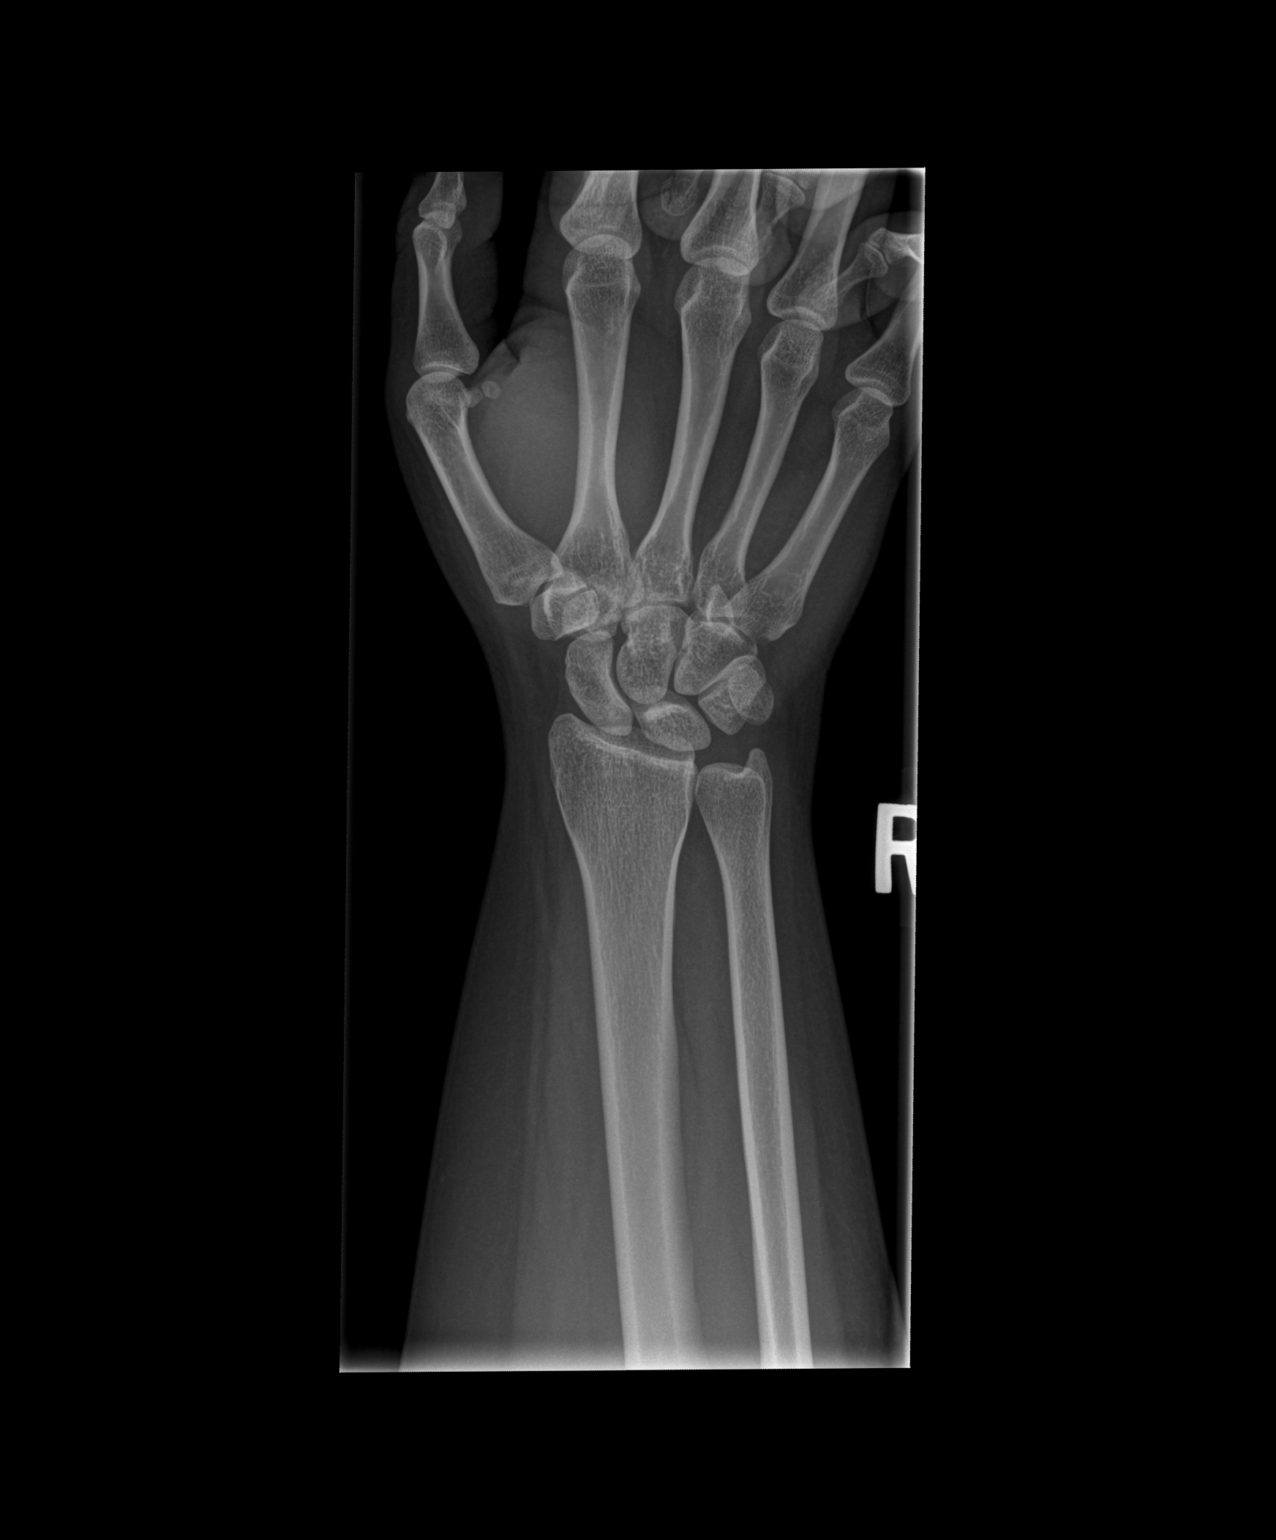

[x wrist obl right]
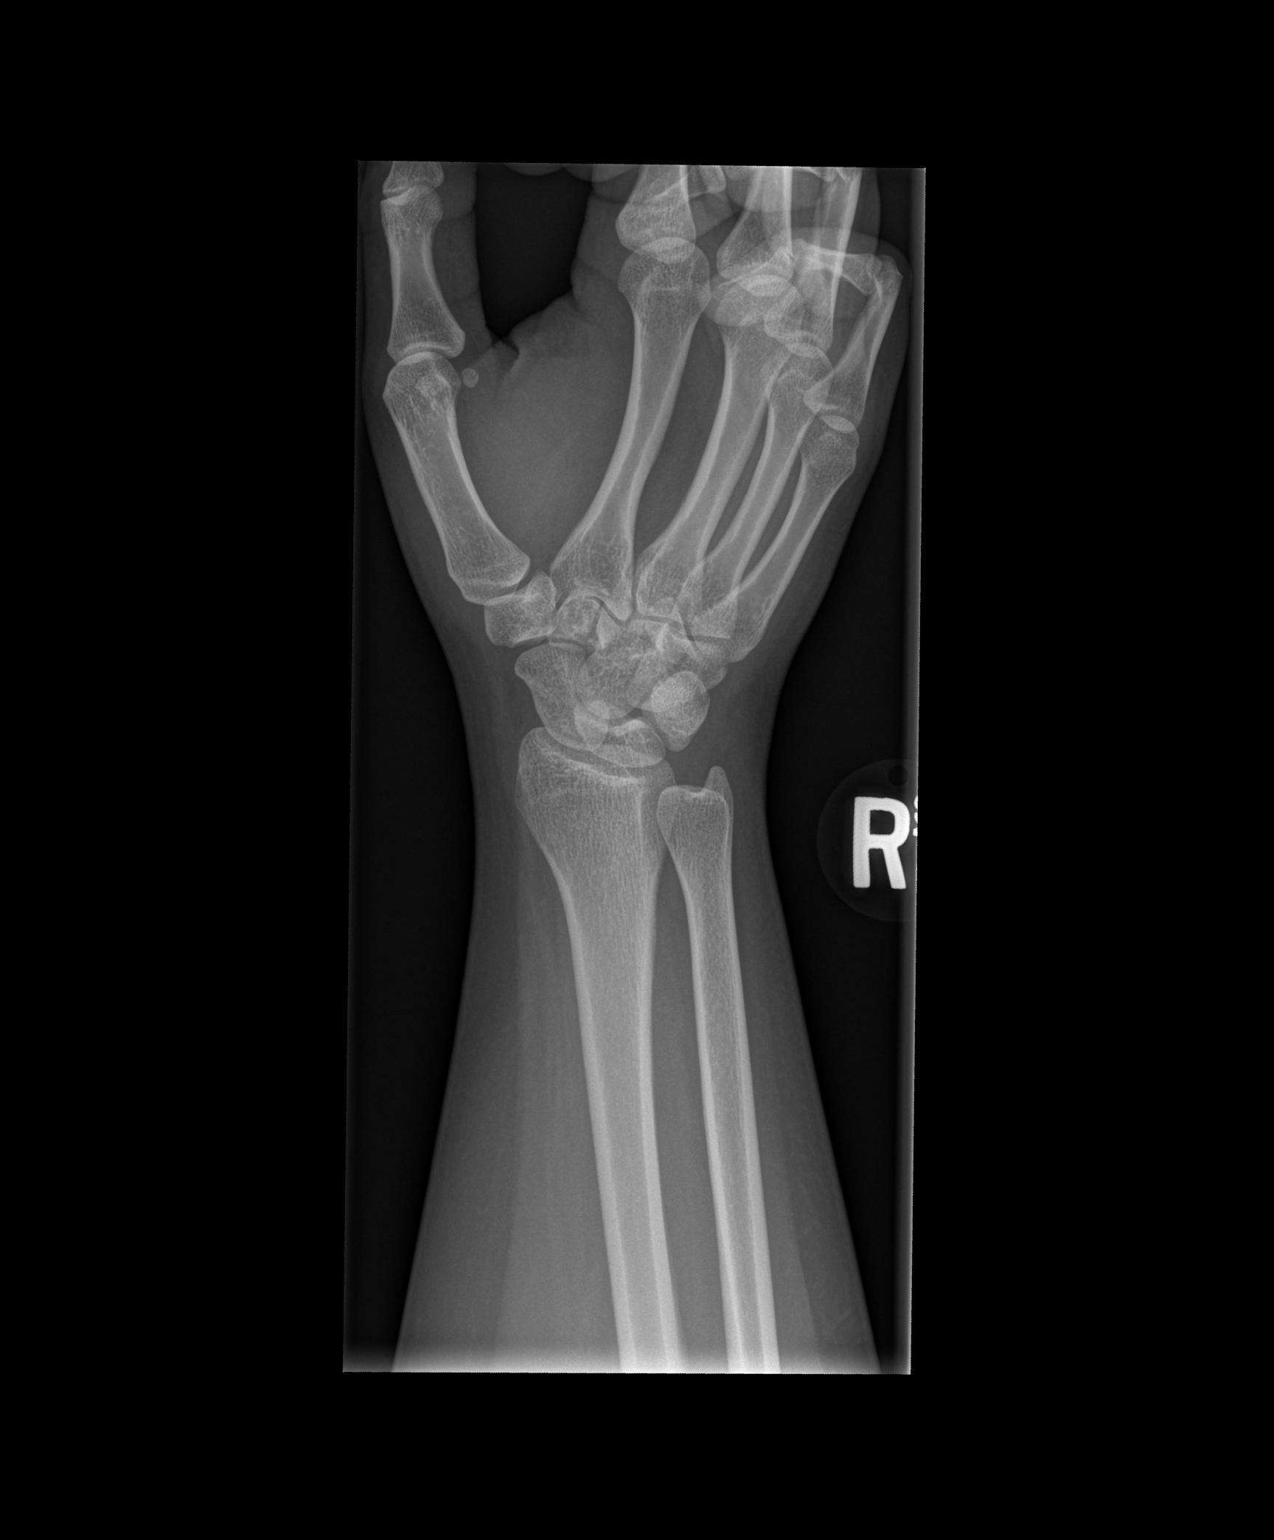

[x wrist lat right]
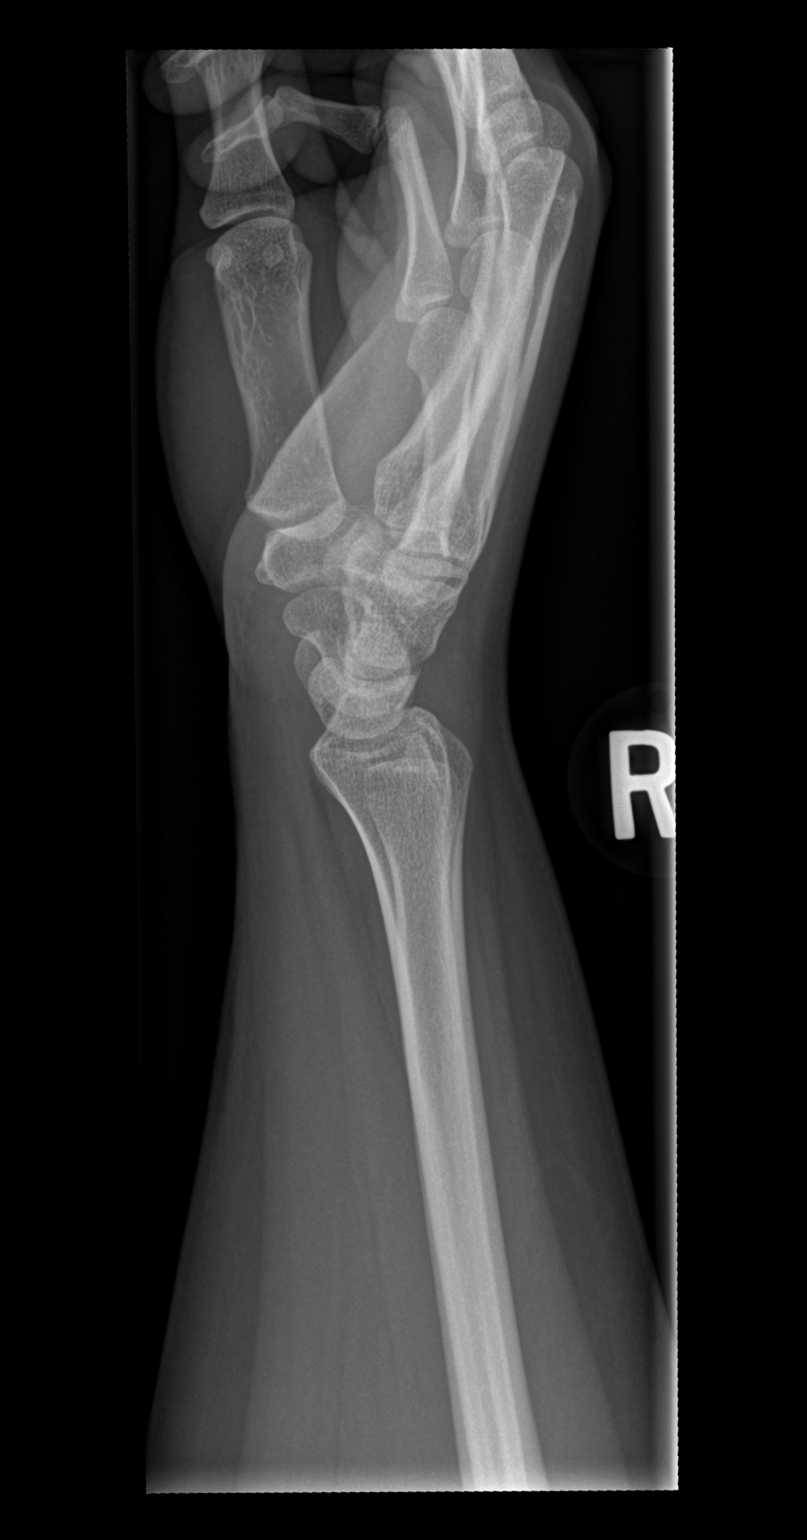

[x wrist navicular view right]
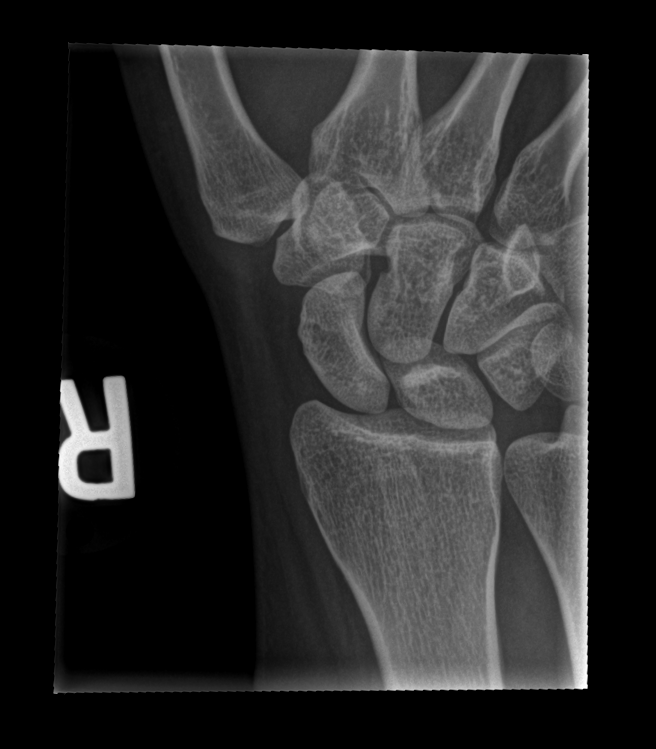

[4 of 4 positions shown; findings below may reference images not displayed]

FINDINGS: There is no evidence of fracture or dislocation. There is no
evidence of arthropathy or other focal bone abnormality. Soft
tissues are unremarkable.
IMPRESSION: Negative.

## 2020-07-05 ENCOUNTER — Telehealth: Payer: Self-pay | Admitting: Neurology

## 2020-07-05 NOTE — Telephone Encounter (Signed)
PA completed through cover my meds. [id number] Will await to hear a response within 72 hrs

## 2020-07-09 ENCOUNTER — Encounter: Payer: Self-pay | Admitting: Neurology

## 2020-07-09 ENCOUNTER — Ambulatory Visit: Payer: BC Managed Care – PPO | Admitting: Neurology

## 2020-07-09 ENCOUNTER — Telehealth: Payer: Self-pay | Admitting: *Deleted

## 2020-07-09 NOTE — Telephone Encounter (Signed)
PA was denied/ CMM did not provide reason. Patient may use a copay card and I can attempt an appeal letter if I receive information on where to appeal.

## 2020-07-09 NOTE — Telephone Encounter (Signed)
No showed follow up appointment. 

## 2020-07-09 NOTE — Progress Notes (Deleted)
[facility]    Provider:  Dr Lucia Gaskins Requesting Provider: No ref. provider found Primary Care Provider:  [name], MD  CC:  Cluster headaches  HPI:  Veronica Baxter is a 30 y.o. female here as requested by No ref. provider found for cluster headaches. She is here with her partner who also provides information. Sudden, out of the  blue, a hot iron rod piercing through her skull, super sudden intense pain, she can see starts, can last 1-2 minutes, always on the left, pain behind the eye, after it subsides she can feel disoriented and dizzy, she has passed out, then it can happen again in 20 minutes or in 2 hours, they come on at random, the most intense pain is one - two minutes but max 10 miuntes but severe and brief, hot poker multiple stabs. Started about 1.5 years ago. She will have them for periods of time multiple times a day, 5-10 times a day, multiple days a week, periods of times it doesn't happen weeks at most, she also has migraines as well, happens around her cycle, a really dull painful whole head, like a band around her head, pulsating/throbbing, light and sound sensitivity, a cold dark room and movement makes it worse. When she gets the other headaches it is scary and she paces and is agitated. It is debilitating for a day -2. He saw Dr. Neale Burly more migraine sin the past and she tried biofeedback, tried flexeril, topamax, imitrex, amitriptyline, imipramine, propranolol. She hs a lot of anxiety. She gets migraines 8 days a month and she has at least half the month of headaches if not every day. Here with her partner who provides much information. Left eyelid gets swollen. No other focal neurologic deficits, associated symptoms, inciting events or modifiable factors.  Reviewed notes, labs and imaging from outside physicians, which showed:lamictal, propranolol, seroquel, amitriptyline, cannot take Verapamil(has had side effects to blood pressure medications), topamax  contraindicated due to severe depression and anxiety and she is on other AEDs  From a thorough review of records she has tried the following:   Review of Systems: Patient complains of symptoms per HPI as well as the following symptoms: headaches, migraines. Pertinent negatives and positives per HPI. All others negative.   Social History   Socioeconomic History  . Marital status: Married    Spouse name: Veronica Baxter  . Number of children: 0  . Years of education: Associates   . Highest education level: Not on file  Occupational History  . Occupation: Archivist     Comment: student  Tobacco Use  . Smoking status: Former Smoker    Types: Cigarettes    Quit date: 2019    Years since quitting: 3.1  . Smokeless tobacco: Never Used  . Tobacco comment: smoked socially   Substance and Sexual Activity  . Alcohol use: No  . Drug use: Not on file  . Sexual activity: Not on file  Other Topics Concern  . Not on file  Social History Narrative   Right handed   Lives with mother and stepfather   Married but her and husband living separately currently   Caffeine use: 5-6/week (iced coffee and sweet tea sometimes)   Social Determinants of Health   Financial Resource Strain: Not on file  Food Insecurity: Not on file  Transportation Needs: Not on file  Physical Activity: Not on file  Stress: Not on file  Social Connections: Not on file  Intimate Partner Violence: Not on file  Family History  Problem Relation Age of Onset  . Obesity Mother   . Obesity Father   . Sleep apnea Father   . Diabetes Maternal Grandmother   . Migraines Maternal Grandmother   . Stroke Maternal Grandfather     Past Medical History:  Diagnosis Date  . Anxiety   . Bipolar disorder (HCC)   . Chronic headaches    since childhood, saw Dr Neale Burly, [facility]  . Cluster headaches   . Depression   . Insomnia   . Migraine     Patient Active Problem List   Diagnosis Date Noted  .  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 NO PAST SURGERIES      Current Outpatient Medications  Medication Sig Dispense Refill  . busPIRone (BUSPAR) 10 MG tablet Take 10 mg by mouth 2 (two) times daily.     . Galcanezumab-gnlm (EMGALITY, 300 MG DOSE,) 100 MG/ML SOSY Inject 300 mg into the skin every 30 (thirty) days. 3.08 mL 11  . indomethacin (INDOCIN SR) 75 MG CR capsule Take 1 capsule (75 mg total) by mouth 2 (two) times daily with a meal. 60 capsule 6  . lamoTRIgine (LAMICTAL) 200 MG tablet Take 200 mg by mouth daily.    Marland Kitchen levonorgestrel (MIRENA) 20 MCG/24HR IUD 1 each by Intrauterine route once.    Marland Kitchen QUEtiapine (SEROQUEL) 100 MG tablet Take 100 mg by mouth at bedtime.     Current Facility-Administered Medications  Medication Dose Route Frequency Provider Last Rate Last Admin  . Galcanezumab-gnlm SOAJ 360 mg  360 mg Subcutaneous Once Anson Fret, MD        Allergies as of 07/09/2020 - Review Complete 04/06/2020  Allergen Reaction Noted  . Penicillins Hives 04/13/2016    Vitals: There were no vitals taken for this visit. Last Weight:  Wt Readings from Last 1 Encounters:  04/06/20 (!) 305 lb (138.3 kg)   Last Height:   Ht Readings from Last 1 Encounters:  04/06/20 5\' 9"  (1.753 m)     Physical exam: Exam: Gen: NAD, conversant, well nourised, obese, well groomed                     CV: RRR, no MRG. No Carotid Bruits. No peripheral edema, warm, nontender Eyes: Conjunctivae clear without exudates or hemorrhage  Neuro: Detailed Neurologic Exam  Speech:    Speech is normal; fluent and spontaneous with normal comprehension.  Cognition:    The patient is oriented to person, place, and time;     recent and remote memory intact;     language fluent;     normal attention, concentration,     fund of knowledge Cranial Nerves:    The pupils are equal, round, and reactive to light. Attempted fundoscopy could  not visualize. Visual fields are full to finger confrontation. Extraocular movements are intact. Trigeminal sensation is intact and the muscles of mastication are normal. The face is symmetric. The palate elevates in the midline. Hearing intact. Voice is normal. Shoulder shrug is normal. The tongue has normal motion without fasciculations.   Coordination: No dysmetria or ataxia   Gait:    Normal native gait  Motor Observation:    No asymmetry, no atrophy, and no involuntary movements noted. Tone:    Normal muscle tone.    Posture:    Posture is normal. normal erect    Strength:    Strength is V/V in  the upper and lower limbs.      Sensation: intact to LT     Reflex Exam:  DTR's:    Deep tendon reflexes in the upper and lower extremities are normal bilaterally.   Toes:    The toes are downgoing bilaterally.   Clonus:    Clonus is absent.    Assessment/Plan:  30 year old with chronic migraines and either cluster headaches or paroxysmal hemicrania.   MRI w/wo contrast  Start Emgality  Start Indomethacin - If not helping by Sunday email me and we will double it to see if this is an indomethacin-responsive headache Blood work   No orders of the defined types were placed in this encounter.  No orders of the defined types were placed in this encounter.  Discussed: To prevent or relieve headaches, try the following: Cool Compress. Lie down and place a cool compress on your head.  Avoid headache triggers. If certain foods or odors seem to have triggered your migraines in the past, avoid them. A headache diary might help you identify triggers.  Include physical activity in your daily routine. Try a daily walk or other moderate aerobic exercise.  Manage stress. Find healthy ways to cope with the stressors, such as delegating tasks on your to-do list.  Practice relaxation techniques. Try deep breathing, yoga, massage and visualization.  Eat regularly. Eating regularly scheduled  meals and maintaining a healthy diet might help prevent headaches. Also, drink plenty of fluids.  Follow a regular sleep schedule. Sleep deprivation might contribute to headaches Consider biofeedback. With this mind-body technique, you learn to control certain bodily functions - such as muscle tension, heart rate and blood pressure - to prevent headaches or reduce headache pain.    Proceed to emergency room if you experience new or worsening symptoms or symptoms do not resolve, if you have new neurologic symptoms or if headache is severe, or for any concerning symptom.   Provided education and documentation from American headache Society toolbox including articles on: chronic migraine medication, cluster headaches, paroxysmal hemicrania, medication overuse headache, chronic migraines, prevention of migraines, behavioral and other nonpharmacologic treatments for headache.  Cc: No ref. provider found,    Naomie Dean, MD  [facility] [address] [place], [place] [address]  Phone [phone] Fax [phone]

## 2020-07-26 NOTE — Telephone Encounter (Signed)
Received a denial letter states Emgality was denied.  It is covered when the medication is used as soon as episodic cluster headache begins and is not covered when the medication will be continue to be used after the cluster period is over.  The notes states pt didn't meet the criteria.   To file an appeal fax to #[phone] within 180 days from 07/06/20.

## 2020-07-30 ENCOUNTER — Telehealth: Payer: Self-pay | Admitting: Neurology

## 2020-07-30 NOTE — Telephone Encounter (Signed)
PT sent mychart appt request. I called and lvm for her to call back to schedule. She was a no show on 3/7 with Dr. Lucia Gaskins.

## 2020-07-31 ENCOUNTER — Encounter: Payer: Self-pay | Admitting: *Deleted

## 2020-07-31 NOTE — Telephone Encounter (Signed)
Per Dr Lucia Gaskins, emgality is to stop at end of cluster headache period. We will appeal.

## 2020-07-31 NOTE — Telephone Encounter (Signed)
Appeal letter has been written and is pending MD signature. Also printed office note to include.

## 2020-08-08 NOTE — Telephone Encounter (Signed)
Received a letter from [employer]. The appeal request has been approved. Effective dates of authorization: 07/05/20-10/05/20. I faxed this letter to the pt's pharmacy. Received a receipt of confirmation.

## 2020-11-14 ENCOUNTER — Encounter: Payer: Self-pay | Admitting: Neurology

## 2020-11-14 ENCOUNTER — Ambulatory Visit: Payer: BC Managed Care – PPO | Admitting: Neurology

## 2020-11-14 VITALS — BP 122/84 | HR 83 | Ht 69.0 in | Wt 305.0 lb

## 2020-11-14 DIAGNOSIS — G44019 Episodic cluster headache, not intractable: Secondary | ICD-10-CM

## 2020-11-14 DIAGNOSIS — G43709 Chronic migraine without aura, not intractable, without status migrainosus: Secondary | ICD-10-CM | POA: Diagnosis not present

## 2020-11-14 MED ORDER — EMGALITY 120 MG/ML ~~LOC~~ SOAJ: 120.0000 mg | SUBCUTANEOUS | 11 refills | Status: DC

## 2020-11-14 MED ORDER — EMGALITY 120 MG/ML ~~LOC~~ SOAJ: 120.0000 mg | SUBCUTANEOUS | 0 refills | Status: DC

## 2020-11-14 MED ORDER — ONDANSETRON 4 MG PO TBDP: 4.0000 mg | ORAL_TABLET | Freq: Three times a day (TID) | ORAL | 3 refills | Status: DC | PRN

## 2020-11-14 MED ORDER — RIZATRIPTAN BENZOATE 10 MG PO TBDP: 10.0000 mg | ORAL_TABLET | ORAL | 3 refills | Status: DC | PRN

## 2020-11-14 NOTE — Progress Notes (Signed)
[facility]    Provider:  Dr Lucia Gaskins Requesting Provider: No ref. provider found Primary Care Provider:  [name], MD  CC:  Cluster headaches and migraines  Interval history November 14, 2020: Patient was doing great unfortunately insurance stopped paying for the [facility], she was on Emgality 300 mg a month for cluster headaches, at this time her cluster episode appears to have resolved, she still has chronic migraines however, she will be getting new insurance in October, today I gave her enough samples for 6 months until her new insurance kicks in.  She was very Adult nurse.  HPI:  Veronica Baxter is a 30 y.o. female here as requested by No ref. provider found for cluster headaches. She is here with her partner who also provides information. Sudden, out of the  blue, a hot iron rod piercing through her skull, super sudden intense pain, she can see starts, can last 1-2 minutes, always on the left, pain behind the eye, after it subsides she can feel disoriented and dizzy, she has passed out, then it can happen again in 20 minutes or in 2 hours, they come on at random, the most intense pain is one - two minutes but max 10 miuntes but severe and brief, hot poker multiple stabs. Started about 1.5 years ago. She will have them for periods of time multiple times a day, 5-10 times a day, multiple days a week, periods of times it doesn't happen weeks at most, she also has migraines as well, happens around her cycle, a really dull painful whole head, like a band around her head, pulsating/throbbing, light and sound sensitivity, a cold dark room and movement makes it worse. When she gets the other headaches it is scary and she paces and is agitated. It is debilitating for a day -2. He saw Dr. Neale Burly more migraine sin the past and she tried biofeedback, tried flexeril, topamax, imitrex, amitriptyline, imipramine, propranolol. She hs a lot of anxiety. She gets migraines 8 days a month and  she has at least half the month of headaches if not every day. Here with her partner who provides much information. Left eyelid gets swollen. No other focal neurologic deficits, associated symptoms, inciting events or modifiable factors.  Reviewed notes, labs and imaging from outside physicians, which showed:lamictal, propranolol, seroquel, amitriptyline, cannot take Verapamil(has had side effects to blood pressure medications), topamax contraindicated due to severe depression and anxiety and she is on other AEDs, imitrex, tried flexeril, topamax, imitrex, amitriptyline, imipramine, propranolol.  From a thorough review of records she has tried the following:   Review of Systems: Patient complains of symptoms per HPI as well as the following symptoms: headaches, migraines. Pertinent negatives and positives per HPI. All others negative.   Social History   Socioeconomic History   Marital status: Married    Spouse name: [name]   Number of children: 0   Years of education: Associates    Highest education level: Not on file  Occupational History   Occupation: Archivist     Comment: student  Tobacco Use   Smoking status: Former    Pack years: 0.00    Types: Cigarettes    Quit date: 2019    Years since quitting: 3.5   Smokeless tobacco: Never   Tobacco comments:    smoked socially   Substance and Sexual Activity   Alcohol use: No   Drug use: Not on file   Sexual activity: Not on file  Other Topics Concern  Not on file  Social History Narrative   Right handed   Lives with mother and stepfather   Married but her and husband living separately currently   Caffeine use: 5-6/week (iced coffee and sweet tea sometimes)   Social Determinants of Health   Financial Resource Strain: Not on file  Food Insecurity: Not on file  Transportation Needs: Not on file  Physical Activity: Not on file  Stress: Not on file  Social Connections: Not on file  Intimate Partner Violence: Not on file     Family History  Problem Relation Age of Onset   Obesity Mother    Obesity Father    Sleep apnea Father    Diabetes Maternal Grandmother    Migraines Maternal Grandmother    Stroke Maternal Grandfather     Past Medical History:  Diagnosis Date   Anxiety    Bipolar disorder (HCC)    Chronic headaches    since childhood, saw Dr [facility], [facility]   Cluster headaches    Depression    Insomnia    Migraine     Patient Active Problem List   Diagnosis Date Noted   Chronic migraine without aura without status migrainosus, not intractable 11/14/2020   Episodic cluster headache, not intractable 11/14/2020   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      Galcanezumab-gnlm (EMGALITY) 120 MG/ML SOAJ Inject 120 mg into the skin every 30 (thirty) days. 1.12 mL 11   Galcanezumab-gnlm (EMGALITY) 120 MG/ML SOAJ Inject 120 mg into the skin every 30 (thirty) days. 6 mL 0   lamoTRIgine (LAMICTAL) 200 MG tablet Take 200 mg by mouth daily.     levonorgestrel (MIRENA) 20 MCG/24HR IUD 1 each by Intrauterine route once.     ondansetron (ZOFRAN-ODT) 4 MG disintegrating tablet Take 1-2 tablets (4-8 mg total) by mouth every 8 (eight) hours as needed. 60 tablet 3   QUEtiapine (SEROQUEL) 100 MG tablet Take 100 mg by mouth at bedtime.     rizatriptan (MAXALT-MLT) 10 MG disintegrating tablet Take 1 tablet (10 mg total) by mouth as needed for migraine. May repeat in 2 hours if needed 27 tablet 3   No current facility-administered medications for this visit.    Allergies as of 11/14/2020 - Review Complete 11/14/2020  Allergen Reaction Noted   Penicillins Hives 04/13/2016    Vitals: BP 122/84   Pulse 83   Ht 5\' 9"  (1.753 m)   Wt (!) 305 lb (138.3 kg)   BMI 45.04  kg/m  Last Weight:  Wt Readings from Last 1 Encounters:  11/14/20 (!) 305 lb (138.3 kg)   Last Height:   Ht Readings from Last 1 Encounters:  11/14/20 5\' 9"  (1.753 m)     Physical exam: Exam: Gen: NAD, conversant, well nourised, obese, well groomed                     CV: RRR, no MRG. No Carotid Bruits. No peripheral edema, warm, nontender Eyes: Conjunctivae clear without exudates or hemorrhage  Neuro: Detailed Neurologic Exam  Speech:    Speech is normal; fluent and spontaneous with normal comprehension.  Cognition:    The patient is oriented to person, place, and time;     recent and  remote memory intact;     language fluent;     normal attention, concentration,     fund of knowledge Cranial Nerves:    The pupils are equal, round, and reactive to light. Attempted fundoscopy could not visualize. Visual fields are full to finger confrontation. Extraocular movements are intact. Trigeminal sensation is intact and the muscles of mastication are normal. The face is symmetric. The palate elevates in the midline. Hearing intact. Voice is normal. Shoulder shrug is normal. The tongue has normal motion without fasciculations.   Coordination: No dysmetria or ataxia   Gait:    Normal native gait  Motor Observation:    No asymmetry, no atrophy, and no involuntary movements noted. Tone:    Normal muscle tone.    Posture:    Posture is normal. normal erect    Strength:    Strength is V/V in the upper and lower limbs.      Sensation: intact to LT     Reflex Exam:  DTR's:    Deep tendon reflexes in the upper and lower extremities are normal bilaterally.   Toes:    The toes are downgoing bilaterally.   Clonus:    Clonus is absent.    Assessment/Plan:  30 year old with chronic migraines and either cluster headaches or paroxysmal hemicrania.  Patient was doing great unfortunately insurance stopped paying for the [facility], she was on Emgality 300 mg a month for cluster  headaches, at this time her cluster episode appears to have resolved, she still has chronic migraines however so we can hopefully continue Emgality at the chronic migraine dose of 120 mg a month, she will be getting new insurance in October, today I gave her enough Emgality samples for 6 months until her new insurance kicks in.  She was very Adult nurse.  She could not tolerate indomethacin.  We also discussed acute management, will also provide rizatriptan and ondansetron for acute migraine management.   No orders of the defined types were placed in this encounter.  Meds ordered this encounter  Medications   Galcanezumab-gnlm (EMGALITY) 120 MG/ML SOAJ    Sig: Inject 120 mg into the skin every 30 (thirty) days.    Dispense:  1.12 mL    Refill:  11   rizatriptan (MAXALT-MLT) 10 MG disintegrating tablet    Sig: Take 1 tablet (10 mg total) by mouth as needed for migraine. May repeat in 2 hours if needed    Dispense:  27 tablet    Refill:  3    This is a 90-day supply. If insurance declines, please give her amount insurance will approve.   ondansetron (ZOFRAN-ODT) 4 MG disintegrating tablet    Sig: Take 1-2 tablets (4-8 mg total) by mouth every 8 (eight) hours as needed.    Dispense:  60 tablet    Refill:  3    This is a 90-day supply. If insurance declines, please give her amount insurance will approve.   Galcanezumab-gnlm (EMGALITY) 120 MG/ML SOAJ    Sig: Inject 120 mg into the skin every 30 (thirty) days.    Dispense:  6 mL    Refill:  0    Discussed: To prevent or relieve headaches, try the following: Cool Compress. Lie down and place a cool compress on your head.  Avoid headache triggers. If certain foods or odors seem to have triggered your migraines in the past, avoid them. A headache diary might help you identify triggers.  Include physical activity in your daily routine.  Try a daily walk or other moderate aerobic exercise.  Manage stress. Find healthy ways to cope with the  stressors, such as delegating tasks on your to-do list.  Practice relaxation techniques. Try deep breathing, yoga, massage and visualization.  Eat regularly. Eating regularly scheduled meals and maintaining a healthy diet might help prevent headaches. Also, drink plenty of fluids.  Follow a regular sleep schedule. Sleep deprivation might contribute to headaches Consider biofeedback. With this mind-body technique, you learn to control certain bodily functions -- such as muscle tension, heart rate and blood pressure -- to prevent headaches or reduce headache pain.    Proceed to emergency room if you experience new or worsening symptoms or symptoms do not resolve, if you have new neurologic symptoms or if headache is severe, or for any concerning symptom.   Provided education and documentation from American headache Society toolbox including articles on: chronic migraine medication, cluster headaches, paroxysmal hemicrania, medication overuse headache, chronic migraines, prevention of migraines, behavioral and other nonpharmacologic treatments for headache.  Cc: No ref. provider found,    Naomie DeanAntonia Shaquandra Galano, MD  [facility] [address] [place], [place] [id number]  Phone [phone] Fax [phone]  I spent 30 minutes of face-to-face and non-face-to-face time with patient on the  1. Chronic migraine without aura without status migrainosus, not intractable   2. Episodic cluster headache, not intractable    diagnosis.  This included previsit chart review, lab review, study review, order entry, electronic health record documentation, patient education on the different diagnostic and therapeutic options, counseling and coordination of care, risks and benefits of management, compliance, or risk factor reduction

## 2020-11-27 ENCOUNTER — Telehealth: Payer: Self-pay | Admitting: *Deleted

## 2020-11-27 NOTE — Telephone Encounter (Signed)
Received request from [facility] pharmacy to do PA for Emgality. Per last office note, patient was given samples to last until patient's new insurance becomes effective in October. I have called the pt and LVM (ok per DPR) asking patient to let us know if something has changed with her insurance as I am seeking to clarify that she is still using samples for now. Left office number in message for call back.

## 2021-02-21 ENCOUNTER — Ambulatory Visit (INDEPENDENT_AMBULATORY_CARE_PROVIDER_SITE_OTHER): Payer: Self-pay | Admitting: Family Medicine

## 2021-02-21 ENCOUNTER — Other Ambulatory Visit: Payer: Self-pay

## 2021-02-21 ENCOUNTER — Encounter: Payer: Self-pay | Admitting: Family Medicine

## 2021-02-21 VITALS — BP 119/79 | HR 83 | Temp 98.6°F | Ht 69.0 in | Wt 307.2 lb

## 2021-02-21 DIAGNOSIS — J029 Acute pharyngitis, unspecified: Secondary | ICD-10-CM

## 2021-02-21 DIAGNOSIS — F3170 Bipolar disorder, currently in remission, most recent episode unspecified: Secondary | ICD-10-CM

## 2021-02-21 DIAGNOSIS — Z7689 Persons encountering health services in other specified circumstances: Secondary | ICD-10-CM

## 2021-02-21 DIAGNOSIS — F411 Generalized anxiety disorder: Secondary | ICD-10-CM

## 2021-02-21 DIAGNOSIS — G43709 Chronic migraine without aura, not intractable, without status migrainosus: Secondary | ICD-10-CM

## 2021-02-21 MED ORDER — BUSPIRONE HCL 10 MG PO TABS: 10.0000 mg | ORAL_TABLET | Freq: Two times a day (BID) | ORAL | 5 refills | Status: DC

## 2021-02-21 MED ORDER — QUETIAPINE FUMARATE 100 MG PO TABS: 100.0000 mg | ORAL_TABLET | Freq: Every day | ORAL | 5 refills | Status: DC

## 2021-02-21 MED ORDER — LAMOTRIGINE 200 MG PO TABS: 200.0000 mg | ORAL_TABLET | Freq: Every day | ORAL | 5 refills | Status: DC

## 2021-02-21 MED ORDER — FLUCONAZOLE 150 MG PO TABS: 150.0000 mg | ORAL_TABLET | Freq: Once | ORAL | 0 refills | Status: AC

## 2021-02-21 MED ORDER — AZITHROMYCIN 250 MG PO TABS: ORAL_TABLET | ORAL | 0 refills | Status: DC

## 2021-02-21 NOTE — Progress Notes (Signed)
New Patient Office Visit  Subjective:  Patient ID: Veronica Baxter, female    DOB: [birth year]  Age: 30 y.o. MRN: [id number]  CC:  Chief Complaint  Patient presents with   New Patient (Initial Visit)    HPI Veronica Baxter presents to establish care. She currently does not have insurance. She has a history of biopolar disorder, anxiety, and depression. 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 She has since graduated and needs a provider who can continue her medication. She takes lamictil, seroquel, and buspar. She reports that this controls her symptoms well and has been on the same dosage for about 3 years. When she does get insurance, she plans to establish care with psychiatry.   She has a history of chronic migraines. She see neurology for this. She currently is on emgality with samples provided by neurology. She takes rizatriptan prn. She reports this controls her symptoms well.   She has had a sore throat for 5 days. The pain is worse with swallowing. She also reports some tenderness in her neck. Denies fever, chills, HA, cough, congestion, nausea, abdominal pain, or vomiting. The pain has been getting worse.   Depression screen PHQ 2/9 02/21/2021  Decreased Interest 1  Down, Depressed, Hopeless 0  PHQ - 2 Score 1  Altered sleeping 0  Tired, decreased energy 1  Change in appetite 0  Feeling bad or failure about yourself  1  Trouble concentrating 1  Moving slowly or fidgety/restless 0  Suicidal thoughts 0  PHQ-9 Score 4  Difficult doing work/chores Somewhat difficult   GAD 7 : Generalized Anxiety Score 02/21/2021  Nervous, Anxious, on Edge 1  Control/stop worrying 0  Worry too much - different things 1  Trouble relaxing 1  Restless 0  Easily annoyed or irritable 0  Afraid - awful might happen 0  Total GAD 7 Score 3  Anxiety Difficulty Somewhat difficult     Past Medical History:  Diagnosis Date   Anxiety    Bipolar  disorder (HCC)    Chronic headaches    since childhood, saw Dr [facility], [facility]   Cluster headaches    Depression    Insomnia    Migraine     Past Surgical History:  Procedure Laterality Date   NO PAST SURGERIES     WISDOM TOOTH EXTRACTION  2017    Family History  Problem Relation Age of Onset   Obesity Mother    Obesity Father    Sleep apnea Father    Cancer Maternal Grandmother        skin   Diabetes Maternal Grandmother    Migraines Maternal Grandmother    Heart attack Maternal Grandmother    Stroke Maternal Grandfather    Cancer Paternal Grandmother    Anxiety disorder Paternal Grandmother     Social History   Socioeconomic History   Marital status: Married    Spouse name: [name]   Number of children: 0   Years of education: 16   Highest education level: Bachelor's degree (e.g., BA, AB, BS)  Occupational History   Occupation: Archivist     Comment: student  Tobacco Use   Smoking status: Former    Packs/day: 0.25    Years: 3.00    Pack years: 0.75    Types: Cigarettes    Quit date: 2019    Years since quitting: 3.8   Smokeless tobacco: Never   Tobacco comments:  smoked socially   Vaping Use   Vaping Use: Never used  Substance and Sexual Activity   Alcohol use: Yes    Alcohol/week: 2.0 standard drinks    Types: 2 Glasses of wine per week   Drug use: Not Currently   Sexual activity: Yes    Birth control/protection: I.U.D.  Other Topics Concern   Not on file  Social History Narrative   Right handed   Lives with mother and stepfather   Married but her and husband living separately currently   Caffeine use: 5-6/week (iced coffee and sweet tea sometimes)   Social Determinants of Health   Financial Resource Strain: Not on file  Food Insecurity: Not on file  Transportation Needs: Not on file  Physical Activity: Not on file  Stress: Not on file  Social Connections: Not on file  Intimate Partner Violence: Not on file     ROS Review of Systems As per HPI.   Objective:   Today's Vitals: BP 119/79   Pulse 83   Temp 98.6 F (37 C) (Temporal)   Ht 5\' 9"  (1.753 m)   Wt (!) 307 lb 4 oz (139.4 kg)   BMI 45.37 kg/m   Physical Exam Vitals and nursing note reviewed.  Constitutional:      General: She is not in acute distress.    Appearance: She is not ill-appearing, toxic-appearing or diaphoretic.  HENT:     Head: Normocephalic and atraumatic.     Right Ear: Tympanic membrane, ear canal and external ear normal.     Left Ear: Tympanic membrane, ear canal and external ear normal.     Nose: Nose normal.     Mouth/Throat:     Tonsils: Tonsillar exudate present. No tonsillar abscesses. 2+ on the right. 2+ on the left.  Cardiovascular:     Rate and Rhythm: Normal rate and regular rhythm.     Heart sounds: No murmur heard. Pulmonary:     Effort: Pulmonary effort is normal. No respiratory distress.     Breath sounds: Normal breath sounds.  Musculoskeletal:     Cervical back: Neck supple.     Right lower leg: No edema.     Left lower leg: No edema.  Lymphadenopathy:     Cervical: Cervical adenopathy present.  Skin:    General: Skin is warm and dry.  Neurological:     General: No focal deficit present.     Mental Status: She is alert and oriented to person, place, and time.  Psychiatric:        Mood and Affect: Mood normal.        Behavior: Behavior normal.    Assessment & Plan:   Veronica Baxter was seen today for new patient (initial visit).  Diagnoses and all orders for this visit:  Bipolar disorder in remission [facility]) Well controlled on current regimen. Refills provided. Plans to establish with psychiatry when she has insurance.  -     lamoTRIgine (LAMICTAL) 200 MG tablet; Take 1 tablet (200 mg total) by mouth daily. -     QUEtiapine (SEROQUEL) 100 MG tablet; Take 1 tablet (100 mg total) by mouth at bedtime.  Generalized anxiety disorder Well controlled on current regimen.  -     busPIRone  (BUSPAR) 10 MG tablet; Take 1 tablet (10 mg total) by mouth 2 (two) times daily.  Chronic migraine without aura without status migrainosus, not intractable Managed by neurology. Well controlled with emgality and rizatriptan.   Sore throat Tonsils 2+ with exudate  and cervical adenopathy. No other symptoms. She is self pay, so will treat empirically for strep. PCN allergy- zpak ordered. Diflucan ordered as she reports yeast infections with abx use.   -     azithromycin (ZITHROMAX Z-PAK) 250 MG tablet; As directed -     fluconazole (DIFLUCAN) 150 MG tablet; Take 1 tablet (150 mg total) by mouth once for 1 dose.  Encounter to establish care  Follow-up: Return in about 6 months (around 08/22/2021) for chronic follow up.   The patient indicates understanding of these issues and agrees with the plan.  Gabriel Earing, FNP

## 2021-02-21 NOTE — Patient Instructions (Signed)

## 2021-05-15 ENCOUNTER — Telehealth: Payer: Self-pay | Admitting: Neurology

## 2021-05-15 ENCOUNTER — Telehealth (INDEPENDENT_AMBULATORY_CARE_PROVIDER_SITE_OTHER): Payer: Self-pay | Admitting: Neurology

## 2021-05-15 ENCOUNTER — Telehealth: Payer: Self-pay | Admitting: *Deleted

## 2021-05-15 DIAGNOSIS — G44019 Episodic cluster headache, not intractable: Secondary | ICD-10-CM

## 2021-05-15 DIAGNOSIS — G43709 Chronic migraine without aura, not intractable, without status migrainosus: Secondary | ICD-10-CM

## 2021-05-15 MED ORDER — ZOLMITRIPTAN 2.5 MG PO TBDP: 2.5000 mg | ORAL_TABLET | ORAL | 11 refills | Status: DC | PRN

## 2021-05-15 MED ORDER — EMGALITY 120 MG/ML ~~LOC~~ SOAJ: SUBCUTANEOUS | 0 refills | Status: DC

## 2021-05-15 NOTE — Telephone Encounter (Signed)
LVM reminding pt of MyChart Video Visit today at 3:00pm

## 2021-05-15 NOTE — Progress Notes (Signed)
[facility]    Provider:  Dr Lucia GaskinsAhern Requesting Provider: No ref. provider found Primary Care Provider:  Blenda MountsBeck, Matthew Todd, MD  CC:  Cluster headaches and migraines  Virtual Visit via Video Note  I connected with Veronica MulderAmber Borski on 05/15/21 at  3:00 PM EST by a video enabled telemedicine application and verified that I am speaking with the correct person using two identifiers.  Location: Patient: home Provider: office   I discussed the limitations of evaluation and management by telemedicine and the availability of in person appointments. The patient expressed understanding and agreed to proceed.   Follow Up Instructions:    I discussed the assessment and treatment plan with the patient. The patient was provided an opportunity to ask questions and all were answered. The patient agreed with the plan and demonstrated an understanding of the instructions.   The patient was advised to call back or seek an in-person evaluation if the symptoms worsen or if the condition fails to improve as anticipated.  I provided 20 minutes of non-face-to-face time during this encounter.   Anson FretAhern, Kelcie Currie B, MD  05/15/2021: She is on Emgality, she has no insurance at all, she has been applying for jobs. Wll give samples, emgality has changed her life, even once a month is spectacular, will leave samples, when she gets insurance and we will try to get it approved again. She has Chronic Migraines so the 1x emgality injection is for chronic migraines. She has episodic clusters need enough during the cluster months 3 injections a month. 70% better in freq and severity.side effects to the maxalt. Zofran helps.follow up 6 months.    Interval history November 14, 2020: Patient was doing great unfortunately [facility] she was on Emgality 300 mg a month for cluster headaches, at this time her cluster episode appears to have resolved, she still has chronic migraines  however, she will be getting new insurance in October, today I gave her enough samples for 6 months until her new insurance kicks in.  She was very Adult nurseappreciative.  HPI:  Veronica Baxter is a 31 y.o. female here as requested by No ref. provider found for cluster headaches. She is here with her partner who also provides information. Sudden, out of the  blue, a hot iron rod piercing through her skull, super sudden intense pain, she can see starts, can last 1-2 minutes, always on the left, pain behind the eye, after it subsides she can feel disoriented and dizzy, she has passed out, then it can happen again in 20 minutes or in 2 hours, they come on at random, the most intense pain is one - two minutes but max 10 miuntes but severe and brief, hot poker multiple stabs. Started about 1.5 years ago. She will have them for periods of time multiple times a day, 5-10 times a day, multiple days a week, periods of times it doesn't happen weeks at most, she also has migraines as well, happens around her cycle, a really dull painful whole head, like a band around her head, pulsating/throbbing, light and sound sensitivity, a cold dark room and movement makes it worse. When she gets the other headaches it is scary and she paces and is agitated. It is debilitating for a day -2. He saw Dr. Neale BurlyFreeman more migraine sin the past and she tried biofeedback, tried flexeril, topamax, imitrex, amitriptyline, imipramine, propranolol. She hs a lot of anxiety. She gets migraines 8 days a month and she has at  least half the month of headaches if not every day. Here with her partner who provides much information. Left eyelid gets swollen. No other focal neurologic deficits, associated symptoms, inciting events or modifiable factors.  Reviewed notes, labs and imaging from outside physicians, which showed:lamictal, propranolol, seroquel, amitriptyline, cannot take Verapamil(has had side effects to blood pressure medications), topamax contraindicated  due to severe depression and anxiety and she is on other AEDs, imitrex, tried flexeril, topamax, imitrex, amitriptyline, imipramine, propranolol.  From a thorough review of records she has tried the following:   Review of Systems: Patient complains of symptoms per HPI as well as the following symptoms: headaches, migraines. Pertinent negatives and positives per HPI. All others negative.   Social History   Socioeconomic History   Marital status: Divorced    Spouse name: [name]   Number of children: 0   Years of education: 16   Highest education level: Bachelor's degree (e.g., BA, AB, BS)  Occupational History   Occupation: Archivist     Comment: student  Tobacco Use   Smoking status: Former    Packs/day: 0.25    Years: 3.00    Pack years: 0.75    Types: Cigarettes    Quit date: 2019    Years since quitting: 4.0   Smokeless tobacco: Never   Tobacco comments:    smoked socially   Advertising account planner   Vaping Use: Never used  Substance and Sexual Activity   Alcohol use: Yes    Alcohol/week: 2.0 standard drinks    Types: 2 Glasses of wine per week   Drug use: Not Currently   Sexual activity: Yes    Birth control/protection: I.U.D.  Other Topics Concern   Not on file  Social History Narrative   Right handed   Lives with mother and stepfather   Married but her and husband living separately currently   Caffeine use: 5-6/week (iced coffee and sweet tea sometimes)   Social Determinants of Health   Financial Resource Strain: Not on file  Food Insecurity: Not on file  Transportation Needs: Not on file  Physical Activity: Not on file  Stress: Not on file  Social Connections: Not on file  Intimate Partner Violence: Not on file    Family History  Problem Relation Age of Onset   Obesity Mother    Obesity Father    Sleep apnea Father    Cancer Maternal Grandmother        skin   Diabetes Maternal Grandmother    Migraines Maternal Grandmother    Heart attack Maternal  Grandmother    Stroke Maternal Grandfather    Cancer Paternal Grandmother    Anxiety disorder Paternal Grandmother     Past Medical History:  Diagnosis Date   Anxiety    Bipolar disorder (HCC)    Chronic headaches    since childhood, saw [facility], [facility]   Cluster headaches    Depression    Insomnia    Migraine     Patient Active Problem List   Diagnosis Date Noted   Bipolar disorder in remission (HCC) 02/21/2021   Generalized anxiety disorder 02/21/2021   Chronic migraine without aura without status migrainosus, not intractable 11/14/2020   Episodic cluster headache, not intractable 11/14/2020   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 May repeat once in 2 hours if needed. 9 tablet 11   azithromycin (ZITHROMAX Z-PAK) 250 MG tablet As directed 6 tablet 0   busPIRone (BUSPAR) 10 MG tablet Take 1 tablet (10 mg total) by mouth 2 (two) times daily. 60 tablet 5   Galcanezumab-gnlm (EMGALITY) 120 MG/ML SOAJ Inject 120 mg into the skin every 30 (thirty) days. 1.12 mL 11   Galcanezumab-gnlm (EMGALITY) 120 MG/ML SOAJ Inject into skin every 30 days (120mg /ML). LOT K, Ex 11-25-22 6.72 mL 0   lamoTRIgine (LAMICTAL) 200 MG tablet Take 1 tablet (200 mg total) by mouth daily. 30 tablet 5   levonorgestrel (MIRENA) 20 MCG/24HR IUD 1 each by Intrauterine route once.     ondansetron (ZOFRAN-ODT) 4 MG disintegrating tablet Take 1-2 tablets (4-8 mg total) by mouth every 8 (eight) hours as needed. 60 tablet 3   QUEtiapine (SEROQUEL) 100 MG tablet Take 1 tablet (100 mg total) by mouth at bedtime. 30 tablet 5   rizatriptan (MAXALT-MLT) 10 MG disintegrating tablet Take 1 tablet (10 mg  total) by mouth as needed for migraine. May repeat in 2 hours if needed 27 tablet 3   No current facility-administered medications for this visit.    Allergies as of 05/15/2021 - Review Complete 02/21/2021  Allergen Reaction Noted   Penicillins Hives 04/13/2016    Vitals: There were no vitals taken for this visit. Last Weight:  Wt Readings from Last 1 Encounters:  02/21/21 (!) 307 lb 4 oz (139.4 kg)   Last Height:   Ht Readings from Last 1 Encounters:  02/21/21 5\' 9"  (1.753 m)    Physical exam: Exam: Gen: NAD, conversant      CV:  Could not perform over Web Video. Denies palpitations or chest pain or SOB. VS: Breathing at a normal rate. Weight appears within normal limits. Not febrile. Eyes: Conjunctivae clear without exudates or hemorrhage  Neuro: Detailed Neurologic Exam  Speech:    Speech is normal; fluent and spontaneous with normal comprehension.  Cognition:    The patient is oriented to person, place, and time;     recent and remote memory intact;     language fluent;     normal attention, concentration,     fund of knowledge Cranial Nerves:    The pupils are equal, round, and reactive to light. Cannot perform fundoscopic exam. Visual fields are full to finger confrontation. Extraocular movements are intact.  The face is symmetric with normal sensation. The palate elevates in the midline. Hearing intact. Voice is normal. Shoulder shrug is normal. The tongue has normal motion without fasciculations.   Motor Observation:   no involuntary movements noted. Tone:    Appears normal  Posture:    Posture is normal. normal erect    Strength:    Strength is anti-gravity and symmetric in the upper and lower limbs.      Sensation: intact to LT      Assessment/Plan:  31 year old with chronic migraines and either cluster headaches or paroxysmal hemicrania.  She is on Emgality, she has no insurance at all, she has been applying for jobs. Wll give samples, emgality has  changed her life, even once a month is spectacular, will leave samples, when she gets insurance and we will try to get it approved again. She has Chronic Migraines so the 1x emgality injection is for chronic migraines. She has episodic clusters  need enough during the cluster months 3 injections a month. 70% better in freq and severity.side effects to the maxalt. Zofran helps.follow up 6 months. Did not tolerate indomethacin. Rizatriptan with side effects. Do not gt pregnant, needs to be off 6 months orior, teratogenicity, she understands   No orders of the defined types were placed in this encounter.   Meds ordered this encounter  Medications   zolmitriptan (ZOMIG-ZMT) 2.5 MG disintegrating tablet    Sig: Take 1 tablet (2.5 mg total) by mouth as needed for migraine. May repeat once in 2 hours if needed.    Dispense:  9 tablet    Refill:  11    Discussed: To prevent or relieve headaches, try the following: Cool Compress. Lie down and place a cool compress on your head.  Avoid headache triggers. If certain foods or odors seem to have triggered your migraines in the past, avoid them. A headache diary might help you identify triggers.  Include physical activity in your daily routine. Try a daily walk or other moderate aerobic exercise.  Manage stress. Find healthy ways to cope with the stressors, such as delegating tasks on your to-do list.  Practice relaxation techniques. Try deep breathing, yoga, massage and visualization.  Eat regularly. Eating regularly scheduled meals and maintaining a healthy diet might help prevent headaches. Also, drink plenty of fluids.  Follow a regular sleep schedule. Sleep deprivation might contribute to headaches Consider biofeedback. With this mind-body technique, you learn to control certain bodily functions -- such as muscle tension, heart rate and blood pressure -- to prevent headaches or reduce headache pain.    Proceed to emergency room if you experience new  or worsening symptoms or symptoms do not resolve, if you have new neurologic symptoms or if headache is severe, or for any concerning symptom.   Provided education and documentation from American headache Society toolbox including articles on: chronic migraine medication, cluster headaches, paroxysmal hemicrania, medication overuse headache, chronic migraines, prevention of migraines, behavioral and other nonpharmacologic treatments for headache.  Cc: No ref. provider found,    Naomie DeanAntonia Yi Haugan, MD  [facility] [address] [place], [place] [id number]  Phone [phone] Fax [phone]  I spent 20 minutes of face-to-face and non-face-to-face time with patient on the  1. Chronic migraine without aura without status migrainosus, not intractable   2. Episodic cluster headache, not intractable     diagnosis.  This included previsit chart review, lab review, study review, order entry, electronic health record documentation, patient education on the different diagnostic and therapeutic options, counseling and coordination of care, risks and benefits of management, compliance, or risk factor reduction

## 2021-05-15 NOTE — Telephone Encounter (Signed)
Per Dr. Lucia Gaskins ok for pt to receive 3 boxes emgality (6 syringes) since pt is without insurance at this time.

## 2021-08-07 ENCOUNTER — Encounter: Payer: Self-pay | Admitting: Family Medicine

## 2021-08-07 ENCOUNTER — Telehealth (INDEPENDENT_AMBULATORY_CARE_PROVIDER_SITE_OTHER): Payer: Self-pay | Admitting: Family Medicine

## 2021-08-07 DIAGNOSIS — F3170 Bipolar disorder, currently in remission, most recent episode unspecified: Secondary | ICD-10-CM

## 2021-08-07 DIAGNOSIS — F411 Generalized anxiety disorder: Secondary | ICD-10-CM

## 2021-08-07 MED ORDER — BUSPIRONE HCL 10 MG PO TABS: 10.0000 mg | ORAL_TABLET | Freq: Two times a day (BID) | ORAL | 5 refills | Status: DC

## 2021-08-07 MED ORDER — LAMOTRIGINE 200 MG PO TABS: 200.0000 mg | ORAL_TABLET | Freq: Every day | ORAL | 5 refills | Status: AC

## 2021-08-07 MED ORDER — QUETIAPINE FUMARATE 100 MG PO TABS: 100.0000 mg | ORAL_TABLET | Freq: Every day | ORAL | 5 refills | Status: DC

## 2021-08-07 NOTE — Progress Notes (Signed)
? ?Virtual Visit via video Note ? ? ?Due to COVID-19 pandemic this visit was conducted virtually. This visit type was conducted due to national recommendations for restrictions regarding the COVID-19 Pandemic (e.g. social distancing, sheltering in place) in an effort to limit this patient's exposure and mitigate transmission in our community. All issues noted in this document were discussed and addressed.  A physical exam was not performed with this format. ? ?I connected with  Veronica Baxter  on 08/07/21 at 1412 by video and verified that I am speaking with the correct person using two identifiers. Veronica Baxter is currently located at home and no one is currently with her during the visit. The provider, Gabriel Earing, FNP is located in their office at time of visit. ? ?I discussed the limitations, risks, security and privacy concerns of performing an evaluation and management service by video  and the availability of in person appointments. I also discussed with the patient that there may be a patient responsible charge related to this service. The patient expressed understanding and agreed to proceed. ? ?CC: depression, anxiety ? ?History and Present Illness: ?Veronica Baxter reports that she is doing well and has no concerns today. She has recently moved to [facility]. This is why she needed a virtual visit today. She is still self pay for now, but will be starting a job in just a few weeks.  ? ? ?  08/07/2021  ?  2:29 PM 02/21/2021  ?  3:17 PM  ?Depression screen PHQ 2/9  ?Decreased Interest 0 1  ?Down, Depressed, Hopeless 0 0  ?PHQ - 2 Score 0 1  ?Altered sleeping 0 0  ?Tired, decreased energy 0 1  ?Change in appetite 0 0  ?Feeling bad or failure about yourself  0 1  ?Trouble concentrating 0 1  ?Moving slowly or fidgety/restless 0 0  ?Suicidal thoughts 0 0  ?PHQ-9 Score 0 4  ?Difficult doing work/chores  Somewhat difficult  ? ? ?  08/07/2021  ?  2:29 PM 02/21/2021  ?  3:18 PM  ?GAD 7 : Generalized Anxiety Score   ?Nervous, Anxious, on Edge 1 1  ?Control/stop worrying 0 0  ?Worry too much - different things 1 1  ?Trouble relaxing 0 1  ?Restless 0 0  ?Easily annoyed or irritable 0 0  ?Afraid - awful might happen 0 0  ?Total GAD 7 Score 2 3  ?Anxiety Difficulty Not difficult at all Somewhat difficult  ? ? ? ? ?ROS ?As per HPI.  ? ? ? ?Observations/Objective: ?Alert and oriented x 3. Able to speak in full sentences without difficulty. Appropriate affect, mood, thoughts, and judgement. Respirations appear unlabored.  ? ?Assessment and Plan: ?Tallia was seen today for depression and anxiety. ? ?Diagnoses and all orders for this visit: ? ?Bipolar disorder in remission [facility]) ?Well controlled. Continue lamictal and seroquel.  ?-     lamoTRIgine (LAMICTAL) 200 MG tablet; Take 1 tablet (200 mg total) by mouth daily. ?-     QUEtiapine (SEROQUEL) 100 MG tablet; Take 1 tablet (100 mg total) by mouth at bedtime. ? ?Generalized anxiety disorder ?Well controlled. Continue buspar.  ?-     busPIRone (BUSPAR) 10 MG tablet; Take 1 tablet (10 mg total) by mouth 2 (two) times daily. ? ? ? ? ?Follow Up Instructions: ?As needed.  ? ?  ?I discussed the assessment and treatment plan with the patient. The patient was provided an opportunity to ask questions and all were answered. The patient agreed  with the plan and demonstrated an understanding of the instructions. ?  ?The patient was advised to call back or seek an in-person evaluation if the symptoms worsen or if the condition fails to improve as anticipated. ? ?The above assessment and management plan was discussed with the patient. The patient verbalized understanding of and has agreed to the management plan. Patient is aware to call the clinic if symptoms persist or worsen. Patient is aware when to return to the clinic for a follow-up visit. Patient educated on when it is appropriate to go to the emergency department.  ? ?Time call ended: 1422 ? ?I provided 10 minutes of face-to-face time during  this encounter. ? ? ? ?Gabriel Earing, FNP ? ? ? ? ?

## 2021-08-07 NOTE — Patient Instructions (Signed)
Managing Anxiety, Adult ?After being diagnosed with anxiety, you may be relieved to know why you have felt or behaved a certain way. You may also feel overwhelmed about the treatment ahead and what it will mean for your life. With care and support, you can manage this condition. ?How to manage lifestyle changes ?Managing stress and anxiety ?Stress is your body's reaction to life changes and events, both good and bad. Most stress will last just a few hours, but stress can be ongoing and can lead to more than just stress. Although stress can play a major role in anxiety, it is not the same as anxiety. Stress is usually caused by something external, such as a deadline, test, or competition. Stress normally passes after the triggering event has ended.  ?Anxiety is caused by something internal, such as imagining a terrible outcome or worrying that something will go wrong that will devastate you. Anxiety often does not go away even after the triggering event is over, and it can become long-term (chronic) worry. It is important to understand the differences between stress and anxiety and to manage your stress effectively so that it does not lead to an anxious response. ?Talk with your health care provider or a counselor to learn more about reducing anxiety and stress. He or she may suggest tension reduction techniques, such as: ?Music therapy. Spend time creating or listening to music that you enjoy and that inspires you. ?Mindfulness-based meditation. Practice being aware of your normal breaths while not trying to control your breathing. It can be done while sitting or walking. ?Centering prayer. This involves focusing on a word, phrase, or sacred image that means something to you and brings you peace. ?Deep breathing. To do this, expand your stomach and inhale slowly through your nose. Hold your breath for 3-5 seconds. Then exhale slowly, letting your stomach muscles relax. ?Self-talk. Learn to notice and identify  thought patterns that lead to anxiety reactions and change those patterns to thoughts that feel peaceful. ?Muscle relaxation. Taking time to tense muscles and then relax them. ?Choose a tension reduction technique that fits your lifestyle and personality. These techniques take time and practice. Set aside 5-15 minutes a day to do them. Therapists can offer counseling and training in these techniques. The training to help with anxiety may be covered by some insurance plans. ?Other things you can do to manage stress and anxiety include: ?Keeping a stress diary. This can help you learn what triggers your reaction and then learn ways to manage your response. ?Thinking about how you react to certain situations. You may not be able to control everything, but you can control your response. ?Making time for activities that help you relax and not feeling guilty about spending your time in this way. ?Doing visual imagery. This involves imagining or creating mental pictures to help you relax. ?Practicing yoga. Through yoga poses, you can lower tension and promote relaxation. ? ?Medicines ?Medicines can help ease symptoms. Medicines for anxiety include: ?Antidepressant medicines. These are usually prescribed for long-term daily control. ?Anti-anxiety medicines. These may be added in severe cases, especially when panic attacks occur. ?Medicines will be prescribed by a health care provider. When used together, medicines, psychotherapy, and tension reduction techniques may be the most effective treatment. ?Relationships ?Relationships can play a big part in helping you recover. Try to spend more time connecting with trusted friends and family members. ?Consider going to couples counseling if you have a partner, taking family education classes, or going to family   therapy. ?Therapy can help you and others better understand your condition. ?How to recognize changes in your anxiety ?Everyone responds differently to treatment for  anxiety. Recovery from anxiety happens when symptoms decrease and stop interfering with your daily activities at home or work. This may mean that you will start to: ?Have better concentration and focus. Worry will interfere less in your daily thinking. ?Sleep better. ?Be less irritable. ?Have more energy. ?Have improved memory. ?It is also important to recognize when your condition is getting worse. Contact your health care provider if your symptoms interfere with home or work and you feel like your condition is not improving. ?Follow these instructions at home: ?Activity ?Exercise. Adults should do the following: ?Exercise for at least 150 minutes each week. The exercise should increase your heart rate and make you sweat (moderate-intensity exercise). ?Strengthening exercises at least twice a week. ?Get the right amount and quality of sleep. Most adults need 7-9 hours of sleep each night. ?Lifestyle ? ?Eat a healthy diet that includes plenty of vegetables, fruits, whole grains, low-fat dairy products, and lean protein. ?Do not eat a lot of foods that are high in fats, added sugars, or salt (sodium). ?Make choices that simplify your life. ?Do not use any products that contain nicotine or tobacco. These products include cigarettes, chewing tobacco, and vaping devices, such as e-cigarettes. If you need help quitting, ask your health care provider. ?Avoid caffeine, alcohol, and certain over-the-counter cold medicines. These may make you feel worse. Ask your pharmacist which medicines to avoid. ?General instructions ?Take over-the-counter and prescription medicines only as told by your health care provider. ?Keep all follow-up visits. This is important. ?Where to find support ?You can get help and support from these sources: ?Self-help groups. ?Online and community organizations. ?A trusted spiritual leader. ?Couples counseling. ?Family education classes. ?Family therapy. ?Where to find more information ?You may find  that joining a support group helps you deal with your anxiety. The following sources can help you locate counselors or support groups near you: ?Mental Health [place]: www.mentalhealthamerica.net ?Anxiety and Depression Association of [place] (ADAA): www.adaa.org ?National Alliance on Mental Illness (NAMI): www.nami.org ?Contact a health care provider if: ?You have a hard time staying focused or finishing daily tasks. ?You spend many hours a day feeling worried about everyday life. ?You become exhausted by worry. ?You start to have headaches or frequently feel tense. ?You develop chronic nausea or diarrhea. ?Get help right away if: ?You have a racing heart and shortness of breath. ?You have thoughts of hurting yourself or others. ?If you ever feel like you may hurt yourself or others, or have thoughts about taking your own life, get help right away. Go to your nearest emergency department or: ?Call your local emergency services (911 in the [place] ?Call a suicide crisis helpline, such as the National Suicide Prevention Lifeline at 1-800-273-8255 or 988 in the [place] This is open 24 hours a day in the [place] ?Text the Crisis Text Line at 741741 (in the [place] ?Summary ?Taking steps to learn and use tension reduction techniques can help calm you and help prevent triggering an anxiety reaction. ?When used together, medicines, psychotherapy, and tension reduction techniques may be the most effective treatment. ?Family, friends, and partners can play a big part in supporting you. ?This information is not intended to replace advice given to you by your health care provider. Make sure you discuss any questions you have with your health care provider. ?Document Revised: 11/14/2020 Document Reviewed: 08/12/2020 ?Elsevier Patient   Education ? 2022 Elsevier Inc. ? ?

## 2021-10-24 ENCOUNTER — Encounter: Payer: Self-pay | Admitting: Neurology

## 2021-10-28 ENCOUNTER — Telehealth: Payer: Self-pay | Admitting: Neurology

## 2021-11-06 ENCOUNTER — Encounter: Payer: Self-pay | Admitting: Neurology

## 2021-11-06 ENCOUNTER — Ambulatory Visit: Payer: 59 | Admitting: Neurology

## 2021-11-06 VITALS — BP 127/86 | HR 85 | Ht 69.0 in | Wt 295.6 lb

## 2021-11-06 DIAGNOSIS — G43009 Migraine without aura, not intractable, without status migrainosus: Secondary | ICD-10-CM | POA: Diagnosis not present

## 2021-11-06 MED ORDER — NURTEC 75 MG PO TBDP: 75.0000 mg | ORAL_TABLET | Freq: Every day | ORAL | 11 refills | Status: DC | PRN

## 2021-11-06 MED ORDER — EMGALITY 120 MG/ML ~~LOC~~ SOAJ: 120.0000 mg | SUBCUTANEOUS | 11 refills | Status: DC

## 2021-11-06 MED ORDER — NURTEC 75 MG PO TBDP: 75.0000 mg | ORAL_TABLET | Freq: Every day | ORAL | 0 refills | Status: DC | PRN

## 2021-11-06 NOTE — Patient Instructions (Signed)
[facility] once monthly Try nurtec samples. If Nurtec works we can prescribe otherwise try [place].  Rimegepant Disintegrating Tablets What is this medication? RIMEGEPANT (ri ME je pant) prevents and treats migraines. It works by blocking a substance in the body that causes migraines. This medicine may be used for other purposes; ask your health care provider or pharmacist if you have questions. COMMON BRAND NAME(S): NURTEC ODT What should I tell my care team before I take this medication? They need to know if you have any of these conditions: Kidney disease Liver disease An unusual or allergic reaction to rimegepant, other medications, foods, dyes, or preservatives Pregnant or trying to get pregnant Breast-feeding How should I use this medication? Take this medication by mouth. Take it as directed on the prescription label. Leave the tablet in the sealed pack until you are ready to take it. With dry hands, open the pack and gently remove the tablet. If the tablet breaks or crumbles, throw it away. Use a new tablet. Place the tablet in the mouth and allow it to dissolve. Then, swallow it. Do not cut, crush, or chew this medication. You do not need water to take this medication. Talk to your care team about the use of this medication in children. Special care may be needed. Overdosage: If you think you have taken too much of this medicine contact a poison control center or emergency room at once. NOTE: This medicine is only for you. Do not share this medicine with others. What if I miss a dose? This does not apply. This medication is not for regular use. What may interact with this medication? Certain medications for fungal infections, such as fluconazole, itraconazole Rifampin This list may not describe all possible interactions. Give your health care provider a list of all the medicines, herbs, non-prescription drugs, or dietary supplements you use. Also tell them if you smoke, drink alcohol,  or use illegal drugs. Some items may interact with your medicine. What should I watch for while using this medication? Visit your care team for regular checks on your progress. Tell your care team if your symptoms do not start to get better or if they get worse. What side effects may I notice from receiving this medication? Side effects that you should report to your care team as soon as possible: Allergic reactions--skin rash, itching, hives, swelling of the face, lips, tongue, or throat Side effects that usually do not require medical attention (report to your care team if they continue or are bothersome): Nausea Stomach pain This list may not describe all possible side effects. Call your doctor for medical advice about side effects. You may report side effects to FDA at 1-800-FDA-1088. Where should I keep my medication? Keep out of the reach of children and pets. Store at room temperature between 20 and 25 degrees C (68 and 77 degrees F). Get rid of any unused medication after the expiration date. To get rid of medications that are no longer needed or have expired: Take the medication to a medication take-back program. Check with your pharmacy or law enforcement to find a location. If you cannot return the medication, check the label or package insert to see if the medication should be thrown out in the garbage or flushed down the toilet. If you are not sure, ask your care team. If it is safe to put it in the trash, take the medication out of the container. Mix the medication with cat litter, dirt, coffee grounds, or other unwanted  substance. Seal the mixture in a bag or container. Put it in the trash. NOTE: This sheet is a summary. It may not cover all possible information. If you have questions about this medicine, talk to your doctor, pharmacist, or health care provider.  2023 Elsevier/Gold Standard (2021-06-12 00:00:00)  Galcanezumab Injection What is this medication? GALCANEZUMAB (gal  ka NEZ ue mab) prevents migraines. It works by blocking a substance in the body that causes migraines. It may also be used to treat cluster headaches. It is a monoclonal antibody. This medicine may be used for other purposes; ask your health care provider or pharmacist if you have questions. COMMON BRAND NAME(S): Emgality What should I tell my care team before I take this medication? They need to know if you have any of these conditions: An unusual or allergic reaction to galcanezumab, other medications, foods, dyes, or preservatives Pregnant or trying to get pregnant Breast-feeding How should I use this medication? This medication is injected under the skin. You will be taught how to prepare and give it. Take it as directed on the prescription label. Keep taking it unless your care team tells you to stop. It is important that you put your used needles and syringes in a special sharps container. Do not put them in a trash can. If you do not have a sharps container, call your pharmacist or care team to get one. Talk to your care team about the use of this medication in children. Special care may be needed. Overdosage: If you think you have taken too much of this medicine contact a poison control center or emergency room at once. NOTE: This medicine is only for you. Do not share this medicine with others. What if I miss a dose? If you miss a dose, take it as soon as you can. If it is almost time for your next dose, take only that dose. Do not take double or extra doses. What may interact with this medication? Interactions are not expected. This list may not describe all possible interactions. Give your health care provider a list of all the medicines, herbs, non-prescription drugs, or dietary supplements you use. Also tell them if you smoke, drink alcohol, or use illegal drugs. Some items may interact with your medicine. What should I watch for while using this medication? Visit your care team for  regular checks on your progress. Tell your care team if your symptoms do not start to get better or if they get worse. What side effects may I notice from receiving this medication? Side effects that you should report to your care team as soon as possible: Allergic reactions or angioedema--skin rash, itching or hives, swelling of the face, eyes, lips, tongue, arms, or legs, trouble swallowing or breathing Side effects that usually do not require medical attention (report to your care team if they continue or are bothersome): Pain, redness, or irritation at injection site This list may not describe all possible side effects. Call your doctor for medical advice about side effects. You may report side effects to FDA at 1-800-FDA-1088. Where should I keep my medication? Keep out of the reach of children and pets. Store in a refrigerator or at room temperature between 20 and 25 degrees C (68 and 77 degrees F). Refrigeration (preferred): Store in the refrigerator. Do not freeze. Keep in the original container until you are ready to take it. Remove the dose from the carton about 30 minutes before it is time for you to use it. If  the dose is not used, it may be stored in original container at room temperature for 7 days. Get rid of any unused medication after the expiration date. Room Temperature: This medication may be stored at room temperature for up to 7 days. Keep it in the original container. Protect from light until time of use. If it is stored at room temperature, get rid of any unused medication after 7 days or after it expires, whichever is first. To get rid of medications that are no longer needed or have expired: Take the medication to a medication take-back program. Check with your pharmacy or law enforcement to find a location. If you cannot return the medication, ask your pharmacist or care team how to get rid of this medication safely. NOTE: This sheet is a summary. It may not cover all  possible information. If you have questions about this medicine, talk to your doctor, pharmacist, or health care provider.  2023 Elsevier/Gold Standard (2021-06-17 00:00:00)

## 2021-11-06 NOTE — Progress Notes (Signed)
[facility]    Provider:  Dr Lucia Gaskins Requesting Provider: Gabriel Earing, FNP Primary Care Provider:  [name], MD  CC:  Cluster headaches and migraines  11/06/2021: Doing fantastic on Emgality, "changed my life". Insurance won;t cover. Gave her 8 months of emgality samples, not covered, has been "life changing", we will see her back end of February and see if insurance. Will try nurtec prn she has 6 migraine days a month and < 10 total headache days a month. Discussed options, will try nurtec prn as triptans with side effects.  Patient complains of symptoms per HPI as well as the following symptoms: migraines . Pertinent negatives and positives per HPI. All others negative   05/15/2021: She is on Emgality, she has no insurance at all, she has been applying for jobs. Wll give samples, emgality has changed her life, even once a month is spectacular, will leave samples, when she gets insurance and we will try to get it approved again. She has Chronic Migraines so the 1x emgality injection is for chronic migraines. She has episodic clusters need enough during the cluster months 3 injections a month. 70% better in freq and severity.side effects to the maxalt. Zofran helps.follow up 6 months.    Interval history November 14, 2020: Patient was doing great unfortunately insurance stopped paying for the [facility], she was on Emgality 300 mg a month for cluster headaches, at this time her cluster episode appears to have resolved, she still has chronic migraines however, she will be getting new insurance in October, today I gave her enough samples for 6 months until her new insurance kicks in.  She was very Adult nurse.  HPI:  Veronica Baxter is a 31 y.o. female here as requested by Gabriel Earing, FNP for cluster headaches. She is here with her partner who also provides information. Sudden, out of the  blue, a hot iron rod piercing through her skull, super sudden intense pain, she  can see starts, can last 1-2 minutes, always on the left, pain behind the eye, after it subsides she can feel disoriented and dizzy, she has passed out, then it can happen again in 20 minutes or in 2 hours, they come on at random, the most intense pain is one - two minutes but max 10 miuntes but severe and brief, hot poker multiple stabs. Started about 1.5 years ago. She will have them for periods of time multiple times a day, 5-10 times a day, multiple days a week, periods of times it doesn't happen weeks at most, she also has migraines as well, happens around her cycle, a really dull painful whole head, like a band around her head, pulsating/throbbing, light and sound sensitivity, a cold dark room and movement makes it worse. When she gets the other headaches it is scary and she paces and is agitated. It is debilitating for a day -2. He saw Dr. Neale Burly more migraine sin the past and she tried biofeedback, tried flexeril, topamax, imitrex, amitriptyline, imipramine, propranolol. She hs a lot of anxiety. She gets migraines 8 days a month and she has at least half the month of headaches if not every day. Here with her partner who provides much information. Left eyelid gets swollen. No other focal neurologic deficits, associated symptoms, inciting events or modifiable factors.  Reviewed notes, labs and imaging from outside physicians, which showed:lamictal, propranolol, seroquel, amitriptyline, cannot take Verapamil(has had side effects to blood pressure medications), topamax contraindicated due to severe depression and anxiety and  she is on other AEDs, imitrex, tried flexeril, topamax, imitrex, amitriptyline, imipramine, propranolol.  From a thorough review of records she has tried the following:   Review of Systems: Patient complains of symptoms per HPI as well as the following symptoms: headaches, migraines. Pertinent negatives and positives per HPI. All others negative.   Social History   Socioeconomic  History   Marital status: Divorced    Spouse name: [name]   Number of children: 0   Years of education: 16   Highest education level: Bachelor's degree (e.g., BA, AB, BS)  Occupational History   Occupation: Archivist     Comment: student  Tobacco Use   Smoking status: Former    Packs/day: 0.25    Years: 3.00    Total pack years: 0.75    Types: Cigarettes    Quit date: 2019    Years since quitting: 4.5   Smokeless tobacco: Never   Tobacco comments:    smoked socially   Vaping Use   Vaping Use: Never used  Substance and Sexual Activity   Alcohol use: Yes    Alcohol/week: 2.0 standard drinks of alcohol    Types: 2 Glasses of wine per week   Drug use: Not Currently   Sexual activity: Yes    Birth control/protection: I.U.D.  Other Topics Concern   Not on file  Social History Narrative   Right handed   Lives with mother and stepfather   Married but her and husband living separately currently   Caffeine use: 5-6/week (iced coffee and sweet tea sometimes)   Social Determinants of Health   Financial Resource Strain: Not on file  Food Insecurity: Not on file  Transportation Needs: Not on file  Physical Activity: Not on file  Stress: Not on file  Social Connections: Not on file  Intimate Partner Violence: Not on file    Family History  Problem Relation Age of Onset   Obesity Mother    Obesity Father    Sleep apnea Father    Cancer Maternal Grandmother        skin   Diabetes Maternal Grandmother    Migraines Maternal Grandmother    Heart attack Maternal Grandmother    Stroke Maternal Grandfather    Cancer Paternal Grandmother    Anxiety disorder Paternal Grandmother     Past Medical History:  Diagnosis Date   Anxiety    Bipolar disorder (HCC)    Chronic headaches    since childhood, saw Dr Neale [facility], [facility]   Cluster headaches    Depression    Insomnia    Migraine     Patient Active Problem List   Diagnosis Date Noted   Bipolar  disorder in remission (HCC) 02/21/2021   Generalized anxiety disorder 02/21/2021   Chronic migraine without aura without status migrainosus, not intractable 11/14/2020   Episodic cluster headache, not intractable 11/14/2020   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 60 tablet 5   Galcanezumab-gnlm (EMGALITY) 120 MG/ML SOAJ Inject 120 mg into the skin every 30 (thirty) days. 1.12 mL 11   lamoTRIgine (LAMICTAL) 200 MG tablet Take 1 tablet (200 mg total) by mouth daily. 30 tablet 5   levonorgestrel (MIRENA)  20 MCG/24HR IUD 1 each by Intrauterine route once.     ondansetron (ZOFRAN-ODT) 4 MG disintegrating tablet Take 1-2 tablets (4-8 mg total) by mouth every 8 (eight) hours as needed. 60 tablet 3   QUEtiapine (SEROQUEL) 100 MG tablet Take 1 tablet (100 mg total) by mouth at bedtime. 30 tablet 5   Rimegepant Sulfate (NURTEC) 75 MG TBDP Take 75 mg by mouth daily as needed. For migraines. Take as close to onset of migraine as possible. One daily maximum. 16 tablet 11   No current facility-administered medications for this visit.    Allergies as of 11/06/2021 - Review Complete 11/06/2021  Allergen Reaction Noted   Penicillins Hives 04/13/2016    Vitals: BP 127/86   Pulse 85   Ht 5\' 9"  (1.753 m)   Wt 295 lb 9.6 oz (134.1 kg)   BMI 43.65 kg/m  Last Weight:  Wt Readings from Last 1 Encounters:  11/06/21 295 lb 9.6 oz (134.1 kg)   Last Height:   Ht Readings from Last 1 Encounters:  11/06/21 5\' 9"  (1.753 m)   Exam: NAD, pleasant                  Speech:    Speech is normal; fluent and spontaneous with normal comprehension.  Cognition:    The patient is oriented to person, place, and time;     recent and remote  memory intact;     language fluent;    Cranial Nerves:    The pupils are equal, round, and reactive to light.Trigeminal sensation is intact and the muscles of mastication are normal. The face is symmetric. The palate elevates in the midline. Hearing intact. Voice is normal. Shoulder shrug is normal. The tongue has normal motion without fasciculations.   Coordination:  No dysmetria  Motor Observation:    No asymmetry, no atrophy, and no involuntary movements noted. Tone:    Normal muscle tone.     Strength:    Strength is V/V in the upper and lower limbs.      Sensation: intact to LT   Assessment/Plan:  31 year old with chronic migraines and either cluster headaches or paroxysmal hemicrania. - Doing fantastic on Emgality, "changed my life". Insurance won;t cover. Gave her 8 months of emgality samples, not covered, has been "life changing", we will see her back end of February and see if insurance.  when she gets different insurance we will try to get it approved again.  - She has Chronic Migraines so the 1x emgality injection is for chronic migraines. She has episodic clusters need enough during the cluster months 3 injections a month. 70% better in freq and severity. - side effects to the maxalt and imitrec. Zofran helps. Did not tolerate indomethacin. Rizatriptan/imitrex with side effects. Do not gt pregnant, needs to be off 6 months prior, teratogenicity, she understands - Will try nurtec prn she has 6 migraine days a month and < 10 total headache days a month. Discussed options, will try nurtec prn as triptans with side effects.   Meds ordered this encounter  Medications   DISCONTD: Rimegepant Sulfate (NURTEC) 75 MG TBDP    Sig: Take 75 mg by mouth daily as needed. For migraines. Take as close to onset of migraine as possible. One daily maximum.    Dispense:  8 tablet    Refill:  0   Galcanezumab-gnlm (EMGALITY) 120 MG/ML SOAJ    Sig: Inject 120 mg into the skin every 30 (thirty)  days.  Dispense:  1.12 mL    Refill:  11   Rimegepant Sulfate (NURTEC) 75 MG TBDP    Sig: Take 75 mg by mouth daily as needed. For migraines. Take as close to onset of migraine as possible. One daily maximum.    Dispense:  16 tablet    Refill:  11    - Will try nurtec prn she has 6 migraine days a month and < 10 total headache days a month. Discussed options, will try nurtec prn as triptans with side effects (imitrex and maxalt)    Discussed: To prevent or relieve headaches, try the following: Cool Compress. Lie down and place a cool compress on your head.  Avoid headache triggers. If certain foods or odors seem to have triggered your migraines in the past, avoid them. A headache diary might help you identify triggers.  Include physical activity in your daily routine. Try a daily walk or other moderate aerobic exercise.  Manage stress. Find healthy ways to cope with the stressors, such as delegating tasks on your to-do list.  Practice relaxation techniques. Try deep breathing, yoga, massage and visualization.  Eat regularly. Eating regularly scheduled meals and maintaining a healthy diet might help prevent headaches. Also, drink plenty of fluids.  Follow a regular sleep schedule. Sleep deprivation might contribute to headaches Consider biofeedback. With this mind-body technique, you learn to control certain bodily functions -- such as muscle tension, heart rate and blood pressure -- to prevent headaches or reduce headache pain.    Proceed to emergency room if you experience new or worsening symptoms or symptoms do not resolve, if you have new neurologic symptoms or if headache is severe, or for any concerning symptom.   Provided education and documentation from [employer] toolbox including articles on: chronic migraine medication, cluster headaches, paroxysmal hemicrania, medication overuse headache, chronic migraines, prevention of migraines, behavioral and other  nonpharmacologic treatments for headache.  Cc: Gabriel Earing, FNP,    Naomie Dean, MD  [facility] [address] [place], [place] [address]  Phone [phone] Fax [phone]  I spent over 30 minutes of face-to-face and non-face-to-face time with patient on the  1. Migraine without aura and without status migrainosus, not intractable      diagnosis.  This included previsit chart review, lab review, study review, order entry, electronic health record documentation, patient education on the different diagnostic and therapeutic options, counseling and coordination of care, risks and benefits of management, compliance, or risk factor reduction

## 2021-11-14 ENCOUNTER — Telehealth: Payer: Self-pay | Admitting: Neurology

## 2021-11-14 MED ORDER — ONDANSETRON 4 MG PO TBDP: 4.0000 mg | ORAL_TABLET | Freq: Three times a day (TID) | ORAL | 0 refills | Status: DC | PRN

## 2021-11-14 NOTE — Telephone Encounter (Signed)
Refill sent to [facility]. Last visit 11/06/21.

## 2021-11-14 NOTE — Telephone Encounter (Signed)
Pt request refill for ondansetron (ZOFRAN-ODT) 4 MG disintegrating tablet at [facility] [phone]

## 2022-06-23 ENCOUNTER — Telehealth: Payer: Self-pay | Admitting: Neurology

## 2022-06-23 ENCOUNTER — Encounter: Payer: Self-pay | Admitting: Neurology

## 2022-06-23 ENCOUNTER — Telehealth (INDEPENDENT_AMBULATORY_CARE_PROVIDER_SITE_OTHER): Payer: 59 | Admitting: Neurology

## 2022-06-23 DIAGNOSIS — G43709 Chronic migraine without aura, not intractable, without status migrainosus: Secondary | ICD-10-CM | POA: Diagnosis not present

## 2022-06-23 MED ORDER — NURTEC 75 MG PO TBDP: 75.0000 mg | ORAL_TABLET | Freq: Every day | ORAL | 11 refills | Status: DC | PRN

## 2022-06-23 MED ORDER — EMGALITY 120 MG/ML ~~LOC~~ SOAJ: 120.0000 mg | SUBCUTANEOUS | 11 refills | Status: DC

## 2022-06-23 NOTE — Telephone Encounter (Signed)
Scheduled for VV with Amy 06/29/23 at 1:00 pm.

## 2022-06-23 NOTE — Progress Notes (Signed)
[facility]    Provider:  Dr Jaynee Eagles Requesting Provider: Gwenlyn Perking, FNP Primary Care Provider:  Keith Rake, MD  Virtual Visit via Video Note  I connected with Veronica Baxter on 06/23/22 at  1:30 PM EST by a video enabled telemedicine application and verified that I am speaking with the correct person using two identifiers.  Location: Patient: home Provider: office   I discussed the limitations of evaluation and management by telemedicine and the availability of in person appointments. The patient expressed understanding and agreed to proceed.  Follow Up Instructions:    I discussed the assessment and treatment plan with the patient. The patient was provided an opportunity to ask questions and all were answered. The patient agreed with the plan and demonstrated an understanding of the instructions.   The patient was advised to call back or seek an in-person evaluation if the symptoms worsen or if the condition fails to improve as anticipated.  I provided 20 minutes of non-face-to-face time during this encounter.   Melvenia Beam, MD   CC:  Cluster headaches and migraines  06/23/2022: Prior to emgality was having > 15-20 migraine days a month and > 15 headache days a month. Now on the emgality she has maybe 4 migraines a month and no headache days, significant improvement on emgality for her migraines. Has been "life changing" will need to see if insurance will cover if not will try Ajovy.   Reviewed notes, labs and imaging from outside physicians, which showed:lamictal, propranolol, seroquel, amitriptyline, cannot take Verapamil(has had side effects to blood pressure medications), topamax contraindicated due to severe depression and anxiety and she is on other AEDs, imitrex, tried flexeril, topamax, maxalt, amitriptyline, imipramine, propranolol, aimovig contraindicated due to constipation. Ajovy.  Patient complains of symptoms per HPI as well as  the following symptoms: nonr . Pertinent negatives and positives per HPI. All others negative   11/06/2021: Doing fantastic on Emgality, "changed my life". Insurance won;t cover. Gave her 8 months of emgality samples, not covered, has been "life changing", we will see her back end of February and see if insurance. Will try nurtec prn she has 6 migraine days a month and < 10 total headache days a month. Discussed options, will try nurtec prn as triptans with side effects.  Patient complains of symptoms per HPI as well as the following symptoms: migraines . Pertinent negatives and positives per HPI. All others negative   05/15/2021: She is on Emgality, she has no insurance at all, she has been applying for jobs. Wll give samples, emgality has changed her life, even once a month is spectacular, will leave samples, when she gets insurance and we will try to get it approved again. She has Chronic Migraines so the 1x emgality injection is for chronic migraines. She has episodic clusters need enough during the cluster months 3 injections a month. 70% better in freq and severity.side effects to the maxalt. Zofran helps.follow up 6 months.    Interval history November 14, 2020: Patient was doing great unfortunately insurance stopped paying for the [facility], she was on Emgality 300 mg a month for cluster headaches, at this time her cluster episode appears to have resolved, she still has chronic migraines however, she will be getting new insurance in October, today I gave her enough samples for 6 months until her new insurance kicks in.  She was very Patent attorney.  HPI:  Veronica Baxter is a 32 y.o. female here as requested by Marjorie Smolder  M, FNP for cluster headaches. She is here with her partner who also provides information. Sudden, out of the  blue, a hot iron rod piercing through her skull, super sudden intense pain, she can see starts, can last 1-2 minutes, always on the left, pain behind the eye, after it subsides  she can feel disoriented and dizzy, she has passed out, then it can happen again in 20 minutes or in 2 hours, they come on at random, the most intense pain is one - two minutes but max 10 miuntes but severe and brief, hot poker multiple stabs. Started about 1.5 years ago. She will have them for periods of time multiple times a day, 5-10 times a day, multiple days a week, periods of times it doesn't happen weeks at most, she also has migraines as well, happens around her cycle, a really dull painful whole head, like a band around her head, pulsating/throbbing, light and sound sensitivity, a cold dark room and movement makes it worse. When she gets the other headaches it is scary and she paces and is agitated. It is debilitating for a day -2. He saw Dr. Domingo Cocking more migraine sin the past and she tried biofeedback, tried flexeril, topamax, imitrex, amitriptyline, imipramine, propranolol. She hs a lot of anxiety. She gets migraines 8 days a month and she has at least half the month of headaches if not every day. Here with her partner who provides much information. Left eyelid gets swollen. No other focal neurologic deficits, associated symptoms, inciting events or modifiable factors.  From a thorough review of records she has tried the following:   Review of Systems: Patient complains of symptoms per HPI as well as the following symptoms: headaches, migraines. Pertinent negatives and positives per HPI. All others negative.   Social History   Socioeconomic History   Marital status: Divorced    Spouse name: Merrilee Seashore   Number of children: 0   Years of education: 16   Highest education level: Bachelor's degree (e.g., BA, AB, BS)  Occupational History   Occupation: Electronics engineer     Comment: student  Tobacco Use   Smoking status: Former    Packs/day: 0.25    Years: 3.00    Total pack years: 0.75    Types: Cigarettes    Quit date: 2019    Years since quitting: 5.1   Smokeless tobacco: Never   Tobacco  comments:    smoked socially   Vaping Use   Vaping Use: Never used  Substance and Sexual Activity   Alcohol use: Yes    Alcohol/week: 2.0 standard drinks of alcohol    Types: 2 Glasses of wine per week   Drug use: Not Currently   Sexual activity: Yes    Birth control/protection: I.U.D.  Other Topics Concern   Not on file  Social History Narrative   Right handed   Lives with mother and stepfather   Married but her and husband living separately currently   Caffeine use: 5-6/week (iced coffee and sweet tea sometimes)   Social Determinants of Health   Financial Resource Strain: Not on file  Food Insecurity: Not on file  Transportation Needs: Not on file  Physical Activity: Not on file  Stress: Not on file  Social Connections: Not on file  Intimate Partner Violence: Not on file    Family History  Problem Relation Age of Onset   Obesity Mother    Obesity Father    Sleep apnea Father    Cancer Maternal  Grandmother        skin   Diabetes Maternal Grandmother    Migraines Maternal Grandmother    Heart attack Maternal Grandmother    Stroke Maternal Grandfather    Cancer Paternal Grandmother    Anxiety disorder Paternal Grandmother     Past Medical History:  Diagnosis Date   Anxiety    Bipolar disorder ([place])    Chronic headaches    since childhood, saw Dr Domingo Cocking, [place]   Cluster headaches    Depression    Insomnia    Migraine     Patient Active Problem List   Diagnosis Date Noted   Bipolar disorder in remission ([place]) 02/21/2021   Generalized anxiety disorder 02/21/2021   Chronic migraine without aura without status migrainosus, not intractable 11/14/2020   Episodic cluster headache, not intractable 11/14/2020   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 Please use copay card:Rx BIN [id number] PCN [id number] group:[id number] ID [id number] expires 05/05/2023 1.12 mL 11   Rimegepant Sulfate (NURTEC) 75 MG TBDP Take 1 tablet (75 mg total) by mouth daily as needed. For migraines. Take as close to onset of migraine as possible. One daily maximum. 16 tablet 11   busPIRone (BUSPAR) 10 MG tablet Take 1 tablet (10 mg total) by mouth 2 (two) times daily. 60 tablet 5   Galcanezumab-gnlm (EMGALITY) 120 MG/ML SOAJ Inject 120 mg into the skin every 30 (thirty) days. 1.12 mL 11   lamoTRIgine (LAMICTAL) 200 MG tablet Take 1 tablet (200 mg total) by mouth daily. 30 tablet 5   levonorgestrel (MIRENA) 20 MCG/24HR IUD 1 each by Intrauterine route once.     ondansetron (ZOFRAN-ODT) 4 MG disintegrating tablet Take 1-2 tablets (4-8 mg total) by mouth every 8 (eight) hours as needed. 60 tablet 0   QUEtiapine (SEROQUEL) 100 MG tablet Take 1 tablet (100 mg total) by mouth at bedtime. 30 tablet 5   No current facility-administered medications for this visit.    Allergies as of 06/23/2022 - Review Complete 06/23/2022  Allergen Reaction Noted   Penicillins Hives 04/13/2016    Vitals: There were no vitals taken for this visit. Last Weight:  Wt Readings from Last 1 Encounters:  11/06/21 295 lb 9.6 oz (134.1 kg)   Last Height:   Ht Readings from Last 1 Encounters:  11/06/21 5' 9"$  (1.753 m)   Physical exam: Exam: Gen: NAD, conversant      CV:  Denies palpitations or chest pain or SOB. VS: Breathing at a normal rate. Weight appears within normal limits. Not febrile. Eyes: Conjunctivae clear without exudates or hemorrhage  Neuro: Detailed Neurologic Exam  Speech:    Speech is normal; fluent and spontaneous with normal comprehension.  Cognition:    The patient is oriented to person, place, and time;     recent and remote memory intact;     language fluent;     normal attention,  concentration,     fund of knowledge Cranial Nerves:    The pupils are equal, round, and reactive to light. Cannot perform fundoscopic exam. Visual fields are full to finger confrontation. Extraocular movements are intact.  The face is symmetric with normal sensation. The palate elevates in the midline.  Hearing intact. Voice is normal. Shoulder shrug is normal. The tongue has normal motion without fasciculations.   Coordination:    Normal finger to nose  Gait:    Normal native gait  Motor Observation:   no involuntary movements noted. Tone:    Appears normal  Posture:    Posture is normal. normal erect    Strength:    Strength is anti-gravity and symmetric in the upper and lower limbs.      Sensation: intact to LT          Assessment/Plan:  32 year old with chronic migraines and either cluster headaches or paroxysmal hemicrania. Now with just migraines and has done exceedingly well on it!  Prior to emgality was having > 15-20 migraine days a month and > 15 headache days a month. Now on the emgality she has maybe 4 migraines a month and no headache days, significant improvement on emgality for her migraines. Has been "life changing" will need to see if insurance will cover if not will try Ajovy.  Filled out emgality copay card for her and put it in the prescription.   Nurtec - works great acutely, sent to [facility]  Reviewed notes, labs and imaging from outside physicians, which showed:lamictal, propranolol, seroquel, amitriptyline, cannot take Verapamil(has had side effects to blood pressure medications), topamax contraindicated due to severe depression and anxiety and she is on other AEDs, imitrex, tried flexeril, topamax, maxalt, amitriptyline, imipramine, propranolol, aimovig contraindicated due to constipation. Ajovy.   Meds ordered this encounter  Medications   Galcanezumab-gnlm (EMGALITY) 120 MG/ML SOAJ    Sig: Inject 120 mg into the skin every 30 (thirty) days. Please use  copay card:Rx BIN [id number] PCN [id number] group:[id number] ID [id number] expires 05/05/2023    Dispense:  1.12 mL    Refill:  11    Please use copay card:Rx BIN [id number] PCN [id number] group:[id number] ID [id number] expires 05/05/2023   Rimegepant Sulfate (NURTEC) 75 MG TBDP    Sig: Take 1 tablet (75 mg total) by mouth daily as needed. For migraines. Take as close to onset of migraine as possible. One daily maximum.    Dispense:  16 tablet    Refill:  11    4 migraines a month and no headache days, failed imitrex and maxalt.    Discussed: To prevent or relieve headaches, try the following: Cool Compress. Lie down and place a cool compress on your head.  Avoid headache triggers. If certain foods or odors seem to have triggered your migraines in the past, avoid them. A headache diary might help you identify triggers.  Include physical activity in your daily routine. Try a daily walk or other moderate aerobic exercise.  Manage stress. Find healthy ways to cope with the stressors, such as delegating tasks on your to-do list.  Practice relaxation techniques. Try deep breathing, yoga, massage and visualization.  Eat regularly. Eating regularly scheduled meals and maintaining a healthy diet might help prevent headaches. Also, drink plenty of fluids.  Follow a regular sleep schedule. Sleep deprivation might contribute to headaches Consider biofeedback. With this mind-body technique, you learn to control certain bodily functions -- such as muscle tension, heart rate and blood pressure -- to prevent headaches or reduce headache pain.    Proceed to emergency room if you experience new or worsening symptoms or symptoms do not resolve, if you have new neurologic symptoms or if headache is severe, or for any concerning symptom.   Provided education and documentation from American headache Society  toolbox including articles on: chronic migraine medication, cluster headaches, paroxysmal hemicrania, medication overuse headache, chronic  migraines, prevention of migraines, behavioral and other nonpharmacologic treatments for headache.  Cc: Gwenlyn Perking, FNP,    Sarina Ill, MD  [facility] [address] [place], [place] [address]  Phone [phone] Fax [phone]  I spent over 30 minutes of face-to-face and non-face-to-face time with patient on the  1. Chronic migraine without aura without status migrainosus, not intractable       diagnosis.  This included previsit chart review, lab review, study review, order entry, electronic health record documentation, patient education on the different diagnostic and therapeutic options, counseling and coordination of care, risks and benefits of management, compliance, or risk factor reduction

## 2022-06-23 NOTE — Telephone Encounter (Signed)
Schedule for one year f/u with NP please thanks migraines can be video she is doing great on emgality

## 2022-07-01 ENCOUNTER — Telehealth: Payer: Self-pay | Admitting: Neurology

## 2022-07-01 NOTE — Telephone Encounter (Signed)
Completed PA Nurtec online [facility] this afternoon

## 2022-07-01 NOTE — Telephone Encounter (Signed)
[place] Veronica Baxter) following up on PA for Rimegepant Sulfate (NURTEC) 75 MG TBDP .

## 2022-07-10 NOTE — Telephone Encounter (Signed)
Veronica Baxter) could you resubmitt PA to the insurance company and provide [place] with an update for  Rimegepant Sulfate (NURTEC) 75 MG TBDP

## 2022-07-11 ENCOUNTER — Other Ambulatory Visit (HOSPITAL_COMMUNITY): Payer: Self-pay

## 2022-07-11 NOTE — Telephone Encounter (Signed)
Patient Advocate Encounter   Received notification that prior authorization for  Nurtec '75MG'$  dispersible tablets is required.   PA submitted on 07/11/2022 [place] Electronic PA Form Status is pending       Lyndel Safe, [place] Patient Advocate Specialist [place] Patient Advocate Team Direct Number: [phone]  Fax: [phone]

## 2022-07-12 ENCOUNTER — Other Ambulatory Visit (HOSPITAL_COMMUNITY): Payer: Self-pay

## 2022-07-14 NOTE — Telephone Encounter (Signed)
Patient Advocate Encounter  Received notification that the request for prior authorization for Nurtec '75MG'$  dispersible tablets has been denied due to . We reviewed the information we received about your condition and circumstances. We used the plan approved policy when making this decision. The policy states that this medication may be approved when: -The member has a clinical condition or needs a specific dosage form for which there is no alternative on the formulary OR -The listed formulary alternatives are not recommended based on published guidelines or clinical literature OR -The formulary alternatives will likely be ineffective or less effective for the member OR -The formulary alternatives will likely cause an adverse effect OR -The member is unable to take the required number of formulary alternatives for the given diagnosis due to a trial and inadequate treatment response or contraindication OR -The member has tried and failed the required number of formulary alternatives. Based on the policy and the information we have, your request is denied. We did not receive any documentation that you meet any of the criteria outlined above. Formulary alternative(s) are Roselyn Meier. Requirement: 3 in a class with 3 or more alternatives, 2 in a class with 2 alternatives, or 1 in a class with only 1 alternative. Please refer to your plan documents for a complete list of alternatives.Lyndel Safe, [place] Patient Advocate Specialist [place] Patient Advocate Team Direct Number: [phone]  Fax: [phone]

## 2022-07-15 NOTE — Telephone Encounter (Addendum)
Spoke to patient received Nurtec samples in Mail from [place] states she has enough samples for six months. Pt states Nurtec works great . Will  finish Nurtec samples if Ubrevly is needed will call back Pt thanked me for calling

## 2022-07-25 ENCOUNTER — Encounter: Payer: Self-pay | Admitting: Neurology

## 2022-07-29 MED ORDER — EMGALITY 120 MG/ML ~~LOC~~ SOAJ: 120.0000 mg | SUBCUTANEOUS | 10 refills | Status: AC

## 2022-08-05 ENCOUNTER — Telehealth: Payer: Self-pay | Admitting: *Deleted

## 2022-08-05 NOTE — Telephone Encounter (Signed)
Received notice from [employer] that PA is needed for [employer].

## 2022-08-07 ENCOUNTER — Other Ambulatory Visit (HOSPITAL_COMMUNITY): Payer: Self-pay

## 2022-08-07 NOTE — Telephone Encounter (Signed)
Pharmacy Patient Advocate Encounter   Received notification from [place] that prior authorization for Emgality 120MG /ML auto-injectors is required/requested.   PA submitted on 08/07/2022 to (ins) [employer] via [employer] or [place]) confirmation # [id number] Status is pending

## 2022-08-08 ENCOUNTER — Other Ambulatory Visit (HOSPITAL_COMMUNITY): Payer: Self-pay

## 2022-08-08 NOTE — Telephone Encounter (Signed)
Pharmacy Patient Advocate Encounter  Prior Authorization for Emgality 120MG /ML auto-injectors (migraine) has been approved by [employer] (ins).    PA # Rx #: [id number] Effective dates: 08/07/2022 through 08/07/2023

## 2023-06-24 NOTE — Patient Instructions (Signed)
 Below is our plan:  We will continue Emgality, Nurtec and ondansetron as prescribed.   Please make sure you are staying well hydrated. I recommend 50-60 ounces daily. Well balanced diet and regular exercise encouraged. Consistent sleep schedule with 6-8 hours recommended.   Please continue follow up with care team as directed.   Follow up with me in 1 year   You may receive a survey regarding today's visit. I encourage you to leave honest feed back as I do use this information to improve patient care. Thank you for seeing me today!   GENERAL HEADACHE INFORMATION:   Natural supplements: Magnesium Oxide or Magnesium Glycinate 500 mg at bed (up to 800 mg daily) Coenzyme Q10 300 mg in AM Vitamin B2- 200 mg twice a day   Add 1 supplement at a time since even natural supplements can have undesirable side effects. You can sometimes buy supplements cheaper (especially Coenzyme Q10) at www.WebmailGuide.co.za or at [facility].  Migraine with aura: There is increased risk for stroke in women with migraine with aura and a contraindication for the combined contraceptive pill for use by women who have migraine with aura. The risk for women with migraine without aura is lower. However other risk factors like smoking are far more likely to increase stroke risk than migraine. There is a recommendation for no smoking and for the use of OCPs without estrogen such as progestogen only pills particularly for women with migraine with aura.Marland Kitchen People who have migraine headaches with auras may be 3 times more likely to have a stroke caused by a blood clot, compared to migraine patients who don't see auras. Women who take hormone-replacement therapy may be 30 percent more likely to suffer a clot-based stroke than women not taking medication containing estrogen. Other risk factors like smoking and high blood pressure may be  much more important.    Vitamins and herbs that show potential:   Magnesium: Magnesium (250 mg twice a day  or 500 mg at bed) has a relaxant effect on smooth muscles such as blood vessels. Individuals suffering from frequent or daily headache usually have low magnesium levels which can be increase with daily supplementation of 400-750 mg. Three trials found 40-90% average headache reduction  when used as a preventative. Magnesium may help with headaches are aura, the best evidence for magnesium is for migraine with aura is its thought to stop the cortical spreading depression we believe is the pathophysiology of migraine aura.Magnesium also demonstrated the benefit in menstrually related migraine.  Magnesium is part of the messenger system in the serotonin cascade and it is a good muscle relaxant.  It is also useful for constipation which can be a side effect of other medications used to treat migraine. Good sources include nuts, whole grains, and tomatoes. Side Effects: loose stool/diarrhea  Riboflavin (vitamin B 2) 200 mg twice a day. This vitamin assists nerve cells in the production of ATP a principal energy storing molecule.  It is necessary for many chemical reactions in the body.  There have been at least 3 clinical trials of riboflavin using 400 mg per day all of which suggested that migraine frequency can be decreased.  All 3 trials showed significant improvement in over half of migraine sufferers.  The supplement is found in bread, cereal, milk, meat, and poultry.  Most Americans get more riboflavin than the recommended daily allowance, however riboflavin deficiency is not necessary for the supplements to help prevent headache. Side effects: energizing, green urine   Coenzyme  Q10: This is present in almost all cells in the body and is critical component for the conversion of energy.  Recent studies have shown that a nutritional supplement of CoQ10 can reduce the frequency of migraine attacks by improving the energy production of cells as with riboflavin.  Doses of 150 mg twice a day have been shown to be  effective.   Melatonin: Increasing evidence shows correlation between melatonin secretion and headache conditions.  Melatonin supplementation has decreased headache intensity and duration.  It is widely used as a sleep aid.  Sleep is natures way of dealing with migraine.  A dose of 3 mg is recommended to start for headaches including cluster headache. Higher doses up to 15 mg has been reviewed for use in Cluster headache and have been used. The rationale behind using melatonin for cluster is that many theories regarding the cause of Cluster headache center around the disruption of the normal circadian rhythm in the brain.  This helps restore the normal circadian rhythm.   HEADACHE DIET: Foods and beverages which may trigger migraine Note that only 20% of headache patients are food sensitive. You will know if you are food sensitive if you get a headache consistently 20 minutes to 2 hours after eating a certain food. Only cut out a food if it causes headaches, otherwise you might remove foods you enjoy! What matters most for diet is to eat a well balanced healthy diet full of vegetables and low fat protein, and to not miss meals.   Chocolate, other sweets ALL cheeses except cottage and cream cheese Dairy products, yogurt, sour cream, ice cream Liver Meat extracts (Bovril, Marmite, meat tenderizers) Meats or fish which have undergone aging, fermenting, pickling or smoking. These include: Hotdogs,salami,Lox,sausage, mortadellas,smoked salmon, pepperoni, Pickled herring Pods of broad bean (English beans, Chinese pea pods, [place] (fava) beans, lima and navy beans Ripe avocado, ripe banana Yeast extracts or active yeast preparations such as Brewer's or Fleishman's (commercial bakes goods are permitted) Tomato based foods, pizza (lasagna, etc.)   MSG (monosodium glutamate) is disguised as many things; look for these common aliases: Monopotassium glutamate Autolysed yeast Hydrolysed protein Sodium  caseinate "flavorings" "all natural preservatives" Nutrasweet   Avoid all other foods that convincingly provoke headaches.   Resources: The Dizzy [place]air Laundry Your Headache Diet, migrainestrong.com  https://zamora-andrews.com/   Caffeine and Migraine For patients that have migraine, caffeine intake more than 3 days per week can lead to dependency and increased migraine frequency. I would recommend cutting back on your caffeine intake as best you can. The recommended amount of caffeine is 200-300 mg daily, although migraine patients may experience dependency at even lower doses. While you may notice an increase in headache temporarily, cutting back will be helpful for headaches in the long run. For more information on caffeine and migraine, visit: https://americanmigrainefoundation.org/resource-library/caffeine-and-migraine/   Headache Prevention Strategies:   1. Maintain a headache diary; learn to identify and avoid triggers.  - This can be a simple note where you log when you had a headache, associated symptoms, and medications used - There are several smartphone apps developed to help track migraines: Migraine Buddy, Migraine Monitor, Curelator N1-Headache App   Common triggers include: Emotional triggers: Emotional/Upset family or friends Emotional/Upset occupation Business reversal/success Anticipation anxiety Crisis-serious Post-crisis periodNew job/position   Physical triggers: Vacation Day Weekend Strenuous Exercise High Altitude Location New Move Menstrual Day Physical Illness Oversleep/Not enough sleep Weather changes Light: Photophobia or light sesnitivity treatment involves a balance between desensitization and reduction in  overly strong input. Use dark polarized glasses outside, but not inside. Avoid bright or fluorescent light, but do not dim environment to the point that going into a normally lit room hurts. Consider  FL-41 tint lenses, which reduce the most irritating wavelengths without blocking too much light.  These can be obtained at axonoptics.com or theraspecs.com Foods: see list above.   2. Limit use of acute treatments (over-the-counter medications, triptans, etc.) to no more than 2 days per week or 10 days per month to prevent medication overuse headache (rebound headache).     3. Follow a regular schedule (including weekends and holidays): Don't skip meals. Eat a balanced diet. 8 hours of sleep nightly. Minimize stress. Exercise 30 minutes per day. Being overweight is associated with a 5 times increased risk of chronic migraine. Keep well hydrated and drink 6-8 glasses of water per day.   4. Initiate non-pharmacologic measures at the earliest onset of your headache. Rest and quiet environment. Relax and reduce stress. Breathe2Relax is a free app that can instruct you on    some simple relaxtion and breathing techniques. Http://Dawnbuse.com is a    free website that provides teaching videos on relaxation.  Also, there are  many apps that   can be downloaded for "mindful" relaxation.  An app called YOGA NIDRA will help walk you through mindfulness. Another app called Calm can be downloaded to give you a structured mindfulness guide with daily reminders and skill development. Headspace for guided meditation Mindfulness Based Stress Reduction Online Course: www.palousemindfulness.com Cold compresses.   5. Don't wait!! Take the maximum allowable dosage of prescribed medication at the first sign of migraine.   6. Compliance:  Take prescribed medication regularly as directed and at the first sign of a migraine.   7. Communicate:  Call your physician when problems arise, especially if your headaches change, increase in frequency/severity, or become associated with neurological symptoms (weakness, numbness, slurred speech, etc.). Proceed to emergency room if you experience new or worsening symptoms or  symptoms do not resolve, if you have new neurologic symptoms or if headache is severe, or for any concerning symptom.   8. Headache/pain management therapies: Consider various complementary methods, including medication, behavioral therapy, psychological counselling, biofeedback, massage therapy, acupuncture, dry needling, and other modalities.  Such measures may reduce the need for medications. Counseling for pain management, where patients learn to function and ignore/minimize their pain, seems to work very well.   9. Recommend changing family's attention and focus away from patient's headaches. Instead, emphasize daily activities. If first question of day is 'How are your headaches/Do you have a headache today?', then patient will constantly think about headaches, thus making them worse. Goal is to re-direct attention away from headaches, toward daily activities and other distractions.   10. Helpful Websites: www.AmericanHeadacheSociety.org PatentHood.ch www.headaches.org TightMarket.nl www.achenet.org

## 2023-06-24 NOTE — Progress Notes (Signed)
 PATIENT: Veronica Baxter DOB: 06/25/1990  REASON FOR VISIT: follow up HISTORY FROM: patient  Virtual Visit via Telephone Note  I connected with Veronica Baxter on 06/29/23 at  1:00 PM EST by telephone and verified that I am speaking with the correct person using two identifiers.   I discussed the limitations, risks, security and privacy concerns of performing an evaluation and management service by telephone and the availability of in person appointments. I also discussed with the patient that there may be a patient responsible charge related to this service. The patient expressed understanding and agreed to proceed.   History of Present Illness:  06/29/23 ALL: Veronica Baxter is a 33 y.o. female here today for follow up for migraines. She continues Scientist, research (life sciences). Migraines remain well managed. She was having 15-20 per month prior to starting injections, now 3-4 per month. Nurtec and ondansetron work well for abortive therapy. She feels mood is well managed. She is sleeping well.   History (copied from Dr Trevor Mace previous note)  06/23/2022:  Prior to emgality was having > 15-20 migraine days a month and > 15 headache days a month. Now on the emgality she has maybe 4 migraines a month and no headache days, significant improvement on emgality for her migraines. Has been "life changing" will need to see if insurance will cover if not will try Ajovy.    Reviewed notes, labs and imaging from outside physicians, which showed:lamictal, propranolol, seroquel, amitriptyline, cannot take Verapamil(has had side effects to blood pressure medications), topamax contraindicated due to severe depression and anxiety and she is on other AEDs, imitrex, tried flexeril, topamax, maxalt, amitriptyline, imipramine, propranolol, aimovig contraindicated due to constipation. Ajovy.   Patient complains of symptoms per HPI as well as the following symptoms: nonr . Pertinent negatives and positives per HPI. All others  negative     11/06/2021:  Doing fantastic on Emgality, "changed my life". Insurance won;t cover. Gave her 8 months of emgality samples, not covered, has been "life changing", we will see her back end of February and see if insurance. Will try nurtec prn she has 6 migraine days a month and < 10 total headache days a month. Discussed options, will try nurtec prn as triptans with side effects.   Patient complains of symptoms per HPI as well as the following symptoms: migraines . Pertinent negatives and positives per HPI. All others negative     05/15/2021:  She is on Emgality, she has no insurance at all, she has been applying for jobs. Wll give samples, emgality has changed her life, even once a month is spectacular, will leave samples, when she gets insurance and we will try to get it approved again. She has Chronic Migraines so the 1x emgality injection is for chronic migraines. She has episodic clusters need enough during the cluster months 3 injections a month. 70% better in freq and severity.side effects to the maxalt. Zofran helps.follow up 6 months.      Interval history November 14, 2020:  Patient was doing great unfortunately insurance stopped paying for the [facility], she was on Emgality 300 mg a month for cluster headaches, at this time her cluster episode appears to have resolved, she still has chronic migraines however, she will be getting new insurance in October, today I gave her enough samples for 6 months until her new insurance kicks in.  She was very Adult nurse.   HPI:  Veronica Baxter is a 33 y.o. female here as requested by Gabriel Earing,  FNP for cluster headaches. She is here with her partner who also provides information. Sudden, out of the  blue, a hot iron rod piercing through her skull, super sudden intense pain, she can see starts, can last 1-2 minutes, always on the left, pain behind the eye, after it subsides she can feel disoriented and dizzy, she has passed out, then it can  happen again in 20 minutes or in 2 hours, they come on at random, the most intense pain is one - two minutes but max 10 miuntes but severe and brief, hot poker multiple stabs. Started about 1.5 years ago. She will have them for periods of time multiple times a day, 5-10 times a day, multiple days a week, periods of times it doesn't happen weeks at most, she also has migraines as well, happens around her cycle, a really dull painful whole head, like a band around her head, pulsating/throbbing, light and sound sensitivity, a cold dark room and movement makes it worse. When she gets the other headaches it is scary and she paces and is agitated. It is debilitating for a day -2. He saw Dr. Neale Burly more migraine sin the past and she tried biofeedback, tried flexeril, topamax, imitrex, amitriptyline, imipramine, propranolol. She hs a lot of anxiety. She gets migraines 8 days a month and she has at least half the month of headaches if not every day. Here with her partner who provides much information. Left eyelid gets swollen. No other focal neurologic deficits, associated symptoms, inciting events or modifiable factors.   Observations/Objective:  Generalized: Well developed, in no acute distress  Mentation: Alert oriented to time, place, history taking. Follows all commands speech and language fluent   Assessment and Plan:  33 y.o. year old female  has a past medical history of Anxiety, Bipolar disorder (HCC), Chronic headaches, Cluster headaches, Depression, Insomnia, and Migraine. here with    ICD-10-CM   1. Chronic migraine without aura without status migrainosus, not intractable  G43.709      Veronica Baxter continues to do well on therapy. We will continue Emgality monthly for prevention and Nurtec and ondansetron as needed for abortive therapy. Healthy lifestyle habits encouraged. She was advised against pregnancy. Using Mirena. Follow up in 1 year.   No orders of the defined types were placed in this  encounter.   No orders of the defined types were placed in this encounter.    Follow Up Instructions:  I discussed the assessment and treatment plan with the patient. The patient was provided an opportunity to ask questions and all were answered. The patient agreed with the plan and demonstrated an understanding of the instructions.   The patient was advised to call back or seek an in-person evaluation if the symptoms worsen or if the condition fails to improve as anticipated.  I provided 15 minutes of non-face-to-face time during this encounter. Patient located at their place of residence during Mychart visit. Provider is in the office.    Shawnie Dapper, NP

## 2023-06-29 ENCOUNTER — Encounter: Payer: Self-pay | Admitting: Family Medicine

## 2023-06-29 ENCOUNTER — Telehealth (INDEPENDENT_AMBULATORY_CARE_PROVIDER_SITE_OTHER): Payer: 59 | Admitting: Family Medicine

## 2023-06-29 DIAGNOSIS — G43709 Chronic migraine without aura, not intractable, without status migrainosus: Secondary | ICD-10-CM | POA: Diagnosis not present

## 2023-06-29 MED ORDER — NURTEC 75 MG PO TBDP: 75.0000 mg | ORAL_TABLET | Freq: Every day | ORAL | 11 refills | Status: DC | PRN

## 2023-06-29 MED ORDER — ONDANSETRON 4 MG PO TBDP: 4.0000 mg | ORAL_TABLET | Freq: Three times a day (TID) | ORAL | 0 refills | Status: AC | PRN

## 2023-06-29 MED ORDER — EMGALITY 120 MG/ML ~~LOC~~ SOAJ: 120.0000 mg | SUBCUTANEOUS | 3 refills | Status: AC

## 2023-07-02 ENCOUNTER — Telehealth: Payer: Self-pay | Admitting: Pharmacist

## 2023-07-02 MED ORDER — UBRELVY 100 MG PO TABS: 100.0000 mg | ORAL_TABLET | Freq: Every day | ORAL | 11 refills | Status: AC | PRN

## 2023-07-02 NOTE — Telephone Encounter (Signed)
 Pharmacy Patient Advocate Encounter   Received notification from Patient Pharmacy that prior authorization for Nurtec 75MG  dispersible tablets is required/requested.   Insurance verification completed.   The patient is insured through [facility] .   Per test claim: PA required; PA submitted to above mentioned insurance via CoverMyMeds Key/confirmation #/EOC [facility] Status is pending

## 2023-07-02 NOTE — Telephone Encounter (Signed)
 Pharmacy Patient Advocate Encounter  Received notification from [facility] that Prior Authorization for Nurtec 75MG  dispersible tablets has been DENIED.  See denial reason below. No denial letter attached in CMM. Will attach denial letter to Media tab once received.   PA #/Case ID/Reference #: [id number]

## 2023-07-02 NOTE — Addendum Note (Signed)
 Addended by: Shawnie Dapper L on: 07/02/2023 04:52 PM   Modules accepted: Orders

## 2023-07-02 NOTE — Telephone Encounter (Signed)
 Amy, looks like her plan prefers Ubrelvy. Ok to change to this?

## 2023-07-15 ENCOUNTER — Telehealth: Payer: Self-pay | Admitting: Pharmacy Technician

## 2023-07-15 ENCOUNTER — Other Ambulatory Visit (HOSPITAL_COMMUNITY): Payer: Self-pay

## 2023-07-15 NOTE — Telephone Encounter (Signed)
 Pharmacy Patient Advocate Encounter  Received notification from [facility] that Prior Authorization for Ubrelvy 100MG  tablets has been APPROVED from 07-15-2023 to 07-14-2024   PA #/Case ID/Reference #: [id number]

## 2023-07-15 NOTE — Telephone Encounter (Signed)
 Pharmacy Patient Advocate Encounter   Received notification from CoverMyMeds that prior authorization for Ubrelvy 100MG  tablets is required/requested.   Insurance verification completed.   The patient is insured through [facility] .   Per test claim: PA required; PA started via CoverMyMeds. KEY [id number] . Waiting for clinical questions to populate.

## 2023-08-11 ENCOUNTER — Telehealth: Payer: Self-pay

## 2023-08-11 ENCOUNTER — Other Ambulatory Visit (HOSPITAL_COMMUNITY): Payer: Self-pay

## 2023-08-11 NOTE — Telephone Encounter (Signed)
 Clinical questions have been answered and PA submitted. PA currently Pending. Please be advised that most companies allow up to 30 days to make a decision. We will advise when a determination has been made, or follow up in 1 week.   Please reach out to our team, Rx Prior Auth Pool, if you haven't heard back in a week.

## 2023-08-11 NOTE — Telephone Encounter (Signed)
 Pharmacy Patient Advocate Encounter   Received notification from Fax that prior authorization for Emgality 120MG /ML auto-injectors (migraine) is required/requested.   Insurance verification completed.   The patient is insured through [facility] .   Per test claim: PA required; PA started via CoverMyMeds. KEY [id number] . Waiting for clinical questions to populate.

## 2023-08-18 ENCOUNTER — Other Ambulatory Visit (HOSPITAL_COMMUNITY): Payer: Self-pay

## 2023-08-18 NOTE — Telephone Encounter (Signed)
     Max day supply is 30 DS.

## 2024-06-09 ENCOUNTER — Telehealth: Payer: Self-pay | Admitting: Family Medicine

## 2024-06-09 NOTE — Telephone Encounter (Signed)
 LVM informing pt of reschedule needed NP OUT

## 2024-07-05 ENCOUNTER — Telehealth: Payer: 59 | Admitting: Family Medicine
